# Patient Record
Sex: Female | Born: 1940 | Race: White | Hispanic: No | Marital: Single | Smoking: Never smoker
Health system: Southern US, Community
[De-identification: ages and names within clinical notes are randomized; demographics above are authoritative.]

## PROBLEM LIST (undated history)

## (undated) DIAGNOSIS — K589 Irritable bowel syndrome without diarrhea: Secondary | ICD-10-CM

## (undated) DIAGNOSIS — I480 Paroxysmal atrial fibrillation: Secondary | ICD-10-CM

## (undated) DIAGNOSIS — E039 Hypothyroidism, unspecified: Secondary | ICD-10-CM

---

## 1978-10-21 HISTORY — PX: BREAST BIOPSY: SHX20

## 2016-08-29 DIAGNOSIS — H3530 Unspecified macular degeneration: Secondary | ICD-10-CM | POA: Insufficient documentation

## 2018-04-30 DIAGNOSIS — H8110 Benign paroxysmal vertigo, unspecified ear: Secondary | ICD-10-CM | POA: Insufficient documentation

## 2018-12-14 DIAGNOSIS — I7782 Antineutrophilic cytoplasmic antibody (ANCA) vasculitis: Secondary | ICD-10-CM | POA: Insufficient documentation

## 2020-08-01 ENCOUNTER — Ambulatory Visit: Payer: Self-pay | Admitting: Nurse Practitioner

## 2020-08-01 ENCOUNTER — Ambulatory Visit: Payer: Medicare Other | Admitting: Nurse Practitioner

## 2020-08-01 ENCOUNTER — Other Ambulatory Visit: Payer: Self-pay

## 2020-08-01 VITALS — BP 120/70 | HR 75 | Temp 97.5°F | Ht 65.0 in | Wt 132.0 lb

## 2020-08-01 DIAGNOSIS — R059 Cough, unspecified: Secondary | ICD-10-CM | POA: Diagnosis not present

## 2020-08-01 LAB — POCT INFLUENZA A/B
Influenza A, POC: NEGATIVE
Influenza B, POC: NEGATIVE

## 2020-08-01 NOTE — Progress Notes (Signed)
Careteam: Patient Care Team: Margaretha Seeds, MD as PCP - General (Internal Medicine)  Advanced Directive information    Allergies  Allergen Reactions  . Sulfa Antibiotics Itching    Chief Complaint  Patient presents with  . Acute Visit    Patient having fatigue, cough , no chest pain, but tight chest. Patient has cough. She does not have diarrhea ,but rumbling. No los of taste or smell Patient started having symptoms after recieng Flu vaccine on 07/27/2020. Coughing has gotten worse within the past few days.     HPI: Patient is a 79 y.o. female seen in today at the [facility] for cough and fatigued.  Reports she has been fatigued for about 1 week. Reports she is fatigue but still able to get up and do her daily activity, cooked a chicken yesterday. Was able to eat normally.  Reports coughing started day before yesterday and last night had a bad episode that made it hard to get her breath. Drank tea with honey with helped the cough. No sore throat.  Spitting after her coughing episode Chest feels tight but no chest pains.  Breathing easily.  No shortness of breath.  No fever or chills.   Took tylenol last night.   Reports drippy nose  No body aches.   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 Has been around a lot of people with family.    Review of Systems:  Review of Systems  Constitutional: Negative for chills, fever and weight loss.  HENT: Negative for congestion, hearing loss, sinus pain, sore throat and tinnitus.   Respiratory: Positive for cough. Negative for sputum production and shortness of breath.   Cardiovascular: Negative for chest pain, palpitations and leg swelling.  Gastrointestinal: Negative for abdominal pain, constipation and diarrhea.  Genitourinary: Negative for dysuria, frequency and urgency.  Musculoskeletal: Negative for falls and joint pain.  Skin: Negative.   Neurological: Negative for  dizziness and headaches.  Psychiatric/Behavioral: Negative for depression and memory loss. The patient does not have insomnia.     No past medical history on file.  Social History:   has no history on file for tobacco use, alcohol use, and drug use.  No family history on file.  Medications: Patient's Medications  New Prescriptions   No medications on file  Previous Medications   AMLODIPINE (NORVASC) 10 MG TABLET    Take 10 mg by mouth daily.   CALCIUM-VITAMIN D (OSCAL WITH D) 500-200 MG-UNIT TABLET    Take 1 tablet by mouth.   LEVOTHYROXINE (SYNTHROID) 125 MCG TABLET    Take 125 mcg by mouth daily before breakfast.   MULTIPLE VITAMINS-MINERALS (PRESERVISION AREDS 2+MULTI VIT PO)    Take by mouth 2 (two) times daily.  Modified Medications   No medications on file  Discontinued Medications   No medications on file    Physical Exam:  Vitals:   08/01/20 1517  BP: 120/70  Pulse: 75  Temp: (!) 97.5 F (36.4 C)  SpO2: 96%  Weight: 132 lb (59.9 kg)  Height: 5\' 5"  (1.651 m)   Body mass index is 21.97 kg/m. Wt Readings from Last 3 Encounters:  08/01/20 132 lb (59.9 kg)    Physical Exam Constitutional:      Appearance: Normal appearance.  HENT:     Head: Normocephalic and atraumatic.     Right Ear: Tympanic membrane, ear canal and external ear normal.  Left Ear: Tympanic membrane, ear canal and external ear normal.     Nose: Congestion and rhinorrhea present.     Mouth/Throat:     Mouth: Mucous membranes are moist.  Eyes:     Pupils: Pupils are equal, round, and reactive to light.  Cardiovascular:     Rate and Rhythm: Normal rate and regular rhythm.     Pulses: Normal pulses.     Heart sounds: Normal heart sounds.  Pulmonary:     Effort: Pulmonary effort is normal.     Breath sounds: Normal breath sounds.  Abdominal:     General: Abdomen is flat.     Palpations: Abdomen is soft.  Skin:    General: Skin is warm and dry.  Neurological:     Mental Status: She  is alert and oriented to person, place, and time.  Psychiatric:        Mood and Affect: Mood normal.        Behavior: Behavior normal.     Labs reviewed: Basic Metabolic Panel: No results for input(s): NA, K, CL, CO2, GLUCOSE, BUN, CREATININE, CALCIUM, MG, PHOS, TSH in the last 8760 hours. Liver Function Tests: No results for input(s): AST, ALT, ALKPHOS, BILITOT, PROT, ALBUMIN in the last 8760 hours. No results for input(s): LIPASE, AMYLASE in the last 8760 hours. No results for input(s): AMMONIA in the last 8760 hours. CBC: No results for input(s): WBC, NEUTROABS, HGB, HCT, MCV, PLT in the last 8760 hours. Lipid Panel: No results for input(s): CHOL, HDL, LDLCALC, TRIG, CHOLHDL, LDLDIRECT in the last 8760 hours. TSH: No results for input(s): TSH in the last 8760 hours. A1C: No results found for: HGBA1C   Assessment/Plan 1. Cough -with fatigue. Vital signs normal and exam reassuring. No other symptoms at this time. Has been feeling poorly over the last week after she visited with her family and was around a lot of younger children and family.  -flu negative. -likely viral after being around a lot of young school age children, will rule out COVID and she will stay quarantined until test resulted.  -continue to make sure staying well hydrated  - SARS-COV-2 RNA,(COVID-19) QUAL NAAT -can use mucinex DM by mouth twice daily with full glass of water.  -strict return precautions given.    Janene Harvey. Biagio Borg  [facility] [phone]

## 2020-08-01 NOTE — Telephone Encounter (Signed)
Called patient to get update on allergies and medications prior to visit. This encounter was created in error - please disregard.

## 2020-08-01 NOTE — Patient Instructions (Addendum)
  May use tylenol 325 mg 2 tablets every 6 hours as needed aches and pains or sore throat humidifier in the home to help with the dry air Mucinex DM by mouth twice daily as needed for cough and congestion with full glass of water  Keep well hydrated Avoid forcefully blowing nose

## 2020-08-03 LAB — UNLABELED: Test Ordered On Req: 3944

## 2020-08-04 ENCOUNTER — Telehealth: Payer: Self-pay | Admitting: *Deleted

## 2020-08-04 NOTE — Telephone Encounter (Signed)
The lab checked on it yesterday and said the results would not be back for 48 hours.

## 2020-08-04 NOTE — Telephone Encounter (Signed)
Patient called and stated that you saw her on Tuesday and did a Covid Test. Stated you told her that it should be back today and she has not heard anything and wonders if you received the results.  Please Advise.

## 2020-08-04 NOTE — Telephone Encounter (Signed)
Patient notified

## 2020-08-07 LAB — PAT ID TIQ DOC: Test Affected: 39448

## 2020-08-07 LAB — SARS-COV-2 RNA,(COVID-19) QUALITATIVE NAAT: SARS CoV2 RNA: NOT DETECTED

## 2020-08-07 LAB — HEMOGLOBIN A1C W/OUT EAG

## 2021-03-30 DIAGNOSIS — M810 Age-related osteoporosis without current pathological fracture: Secondary | ICD-10-CM | POA: Insufficient documentation

## 2021-04-02 ENCOUNTER — Other Ambulatory Visit: Payer: Self-pay | Admitting: Infectious Diseases

## 2021-04-02 DIAGNOSIS — Z1231 Encounter for screening mammogram for malignant neoplasm of breast: Secondary | ICD-10-CM

## 2021-04-12 ENCOUNTER — Other Ambulatory Visit: Payer: Self-pay

## 2021-04-12 ENCOUNTER — Ambulatory Visit
Admission: RE | Admit: 2021-04-12 | Discharge: 2021-04-12 | Disposition: A | Discharge status: Home / Self Care | Payer: Medicare Other | Source: Ambulatory Visit | Attending: Infectious Diseases | Admitting: Infectious Diseases

## 2021-04-12 DIAGNOSIS — Z1231 Encounter for screening mammogram for malignant neoplasm of breast: Secondary | ICD-10-CM | POA: Diagnosis present

## 2021-04-20 ENCOUNTER — Inpatient Hospital Stay
Admission: RE | Admit: 2021-04-20 | Discharge: 2021-04-20 | Disposition: A | Discharge status: Home / Self Care | Payer: Self-pay | Source: Ambulatory Visit | Attending: *Deleted | Admitting: *Deleted

## 2021-04-20 ENCOUNTER — Other Ambulatory Visit: Payer: Self-pay | Admitting: *Deleted

## 2021-04-20 DIAGNOSIS — Z1231 Encounter for screening mammogram for malignant neoplasm of breast: Secondary | ICD-10-CM

## 2021-04-20 LAB — COLOGUARD: COLOGUARD: NEGATIVE

## 2021-04-20 LAB — EXTERNAL GENERIC LAB PROCEDURE: COLOGUARD: NEGATIVE

## 2022-07-09 ENCOUNTER — Emergency Department
Admission: EM | Admit: 2022-07-09 | Discharge: 2022-07-09 | Disposition: A | Discharge status: Home / Self Care | Payer: Medicare Other | Attending: Emergency Medicine | Admitting: Emergency Medicine

## 2022-07-09 DIAGNOSIS — I10 Essential (primary) hypertension: Secondary | ICD-10-CM | POA: Insufficient documentation

## 2022-07-09 DIAGNOSIS — R42 Dizziness and giddiness: Secondary | ICD-10-CM | POA: Diagnosis present

## 2022-07-09 DIAGNOSIS — E039 Hypothyroidism, unspecified: Secondary | ICD-10-CM | POA: Insufficient documentation

## 2022-07-09 DIAGNOSIS — R55 Syncope and collapse: Secondary | ICD-10-CM | POA: Diagnosis not present

## 2022-07-09 LAB — CBC
HCT: 38.1 % (ref 36.0–46.0)
Hemoglobin: 12.2 g/dL (ref 12.0–15.0)
MCH: 29.5 pg (ref 26.0–34.0)
MCHC: 32 g/dL (ref 30.0–36.0)
MCV: 92 fL (ref 80.0–100.0)
Platelets: 204 10*3/uL (ref 150–400)
RBC: 4.14 MIL/uL (ref 3.87–5.11)
RDW: 11.9 % (ref 11.5–15.5)
WBC: 6 10*3/uL (ref 4.0–10.5)
nRBC: 0 % (ref 0.0–0.2)

## 2022-07-09 LAB — URINALYSIS, ROUTINE W REFLEX MICROSCOPIC
Bacteria, UA: NONE SEEN
Bilirubin Urine: NEGATIVE
Glucose, UA: NEGATIVE mg/dL
Hgb urine dipstick: NEGATIVE
Ketones, ur: NEGATIVE mg/dL
Nitrite: NEGATIVE
Protein, ur: 100 mg/dL — AB
Specific Gravity, Urine: 1.013 (ref 1.005–1.030)
Squamous Epithelial / HPF: NONE SEEN (ref 0–5)
pH: 6 (ref 5.0–8.0)

## 2022-07-09 LAB — BASIC METABOLIC PANEL
Anion gap: 6 (ref 5–15)
BUN: 16 mg/dL (ref 8–23)
CO2: 21 mmol/L — ABNORMAL LOW (ref 22–32)
Calcium: 8.2 mg/dL — ABNORMAL LOW (ref 8.9–10.3)
Chloride: 114 mmol/L — ABNORMAL HIGH (ref 98–111)
Creatinine, Ser: 0.95 mg/dL (ref 0.44–1.00)
GFR, Estimated: 60 mL/min — ABNORMAL LOW (ref >60)
Glucose, Bld: 131 mg/dL — ABNORMAL HIGH (ref 70–99)
Potassium: 3.8 mmol/L (ref 3.5–5.1)
Sodium: 141 mmol/L (ref 135–145)

## 2022-07-09 LAB — TROPONIN I (HIGH SENSITIVITY): Troponin I (High Sensitivity): 9 ng/L (ref <18)

## 2022-07-09 LAB — TSH: TSH: 2.174 u[IU]/mL (ref 0.350–4.500)

## 2022-07-09 MED ORDER — SODIUM CHLORIDE 0.9 % IV BOLUS
500.0000 mL | Freq: Once | INTRAVENOUS | Status: AC
  Administered 2022-07-09: 500 mL via INTRAVENOUS

## 2022-07-09 NOTE — ED Notes (Signed)
ED Provider at bedside. 

## 2022-07-09 NOTE — ED Triage Notes (Signed)
No loc, fell down , no co of pain , pt from [facility]

## 2022-07-09 NOTE — ED Provider Notes (Signed)
[facility] Provider Note    Event Date/Time   First MD Initiated Contact with Patient 07/09/22 1646     (approximate)   History   Loss of Consciousness (Pt fell , no loc ) and Near Syncope (No loc, fell down , no co of pain , pt from [facility] )   HPI  [facility] is a 81 y.o. female who on review of previous records from [place] system has a history of hypothyroidism, hypertension and palpitations   [facility] also reports that she has had 2 previous episodes where she has passed out, and about 2 years ago she was admitted and a different hospital and had a evaluation and work-up at that time for a syncopal episode that occurred while she was at a grandchild's play performance.  Today she was in her normal health, had exercise at the pool, ate a small lunch, and went to play bridge.  She reports she started feeling lightheaded when she was standing and walking at the bridge game, and then as she was there she started to feel very lightheaded and knew she was going to pass out.  She did not fully pass out but rather went to the floor, and did not strike her head or suffer injury.  She reports people there also told her she did not fully pass out, but she felt as though she was about to.  She had no preceding symptoms of chest pain nausea or vomiting.  She felt slightly lightheaded for about the for 5 minutes proceeding now.  No symptoms now.  She feels well.  She reports that the EMT at [facility] or nurse checked her blood pressure and it was 80 on their first check at the facility.    Physical Exam   Triage Vital Signs: ED Triage Vitals  Enc Vitals Group     BP 07/09/22 1416 105/65     Pulse Rate 07/09/22 1416 62     Resp 07/09/22 1416 17     Temp 07/09/22 1416 98.8 F (37.1 C)     Temp Source 07/09/22 1416 Oral     SpO2 07/09/22 1416 98 %     Weight 07/09/22 1417 154 lb 5.2 oz (70 kg)     Height 07/09/22 1417 5\' 5"  (1.651 m)     Head Circumference  --      Peak Flow --      Pain Score 07/09/22 1417 0     Pain Loc --      Pain Edu? --      Excl. in [place]? --     Most recent vital signs: Vitals:   07/09/22 1732 07/09/22 1830  BP: 118/66 (!) 143/82  Pulse: 63 85  Resp: 16   Temp: 97.9 F (36.6 C)   SpO2: 99% 99%     General: Awake, no distress.  Very pleasant.  Fully oriented without distress or concern.  Reports she feels well at this time CV:  Good peripheral perfusion.  Normal heart tones.  Occasional slight irregularity.  I reviewed telemetry/rhythm strip, and I do not see evidence of notable ectopy.  Heart tones are normal and there are no murmurs.Resp:  Normal effort.  Bilateral clear with normal work of breathing Abd:  No distention.  Soft nontender nondistended Other:  No lower extremity edema venous cords or congestion  Normal smile.  Normal extraocular movements.  Moves all extremities without difficulty.  No notable neurologic deficits and normocephalic atraumatic  ED Results / Procedures / Treatments   Labs (all labs ordered are listed, but only abnormal results are displayed) Labs Reviewed  BASIC METABOLIC PANEL - Abnormal; Notable for the following components:      Result Value   Chloride 114 (*)    CO2 21 (*)    Glucose, Bld 131 (*)    Calcium 8.2 (*)    GFR, Estimated 60 (*)    All other components within normal limits  URINALYSIS, ROUTINE W REFLEX MICROSCOPIC - Abnormal; Notable for the following components:   Color, Urine YELLOW (*)    APPearance HAZY (*)    Protein, ur 100 (*)    Leukocytes,Ua SMALL (*)    All other components within normal limits  CBC  TSH  CBG MONITORING, ED  TROPONIN I (HIGH SENSITIVITY)     EKG  Interpreted by me at 1435 heart rate approximately 60.  Normal sinus rhythm.  T waves with a somewhat peaked to slight hyperacute appearance and in anterior and left precordial distribution.  Very mild nonspecific T wave abnormality.  No evidence of obvious or frank ischemia  denoted. (No prior for comparison)    RADIOLOGY No indication for central neurologic imaging.  Patient with normal orientation normal neurologic exam at this time  No acute chest pain.  No dyspnea.   PROCEDURES:  Critical Care performed: No  Procedures   MEDICATIONS ORDERED IN ED: Medications  sodium chloride 0.9 % bolus 500 mL (0 mLs Intravenous Stopped 07/09/22 1800)     IMPRESSION / MDM / ASSESSMENT AND PLAN / ED COURSE  I reviewed the triage vital signs and the nursing notes.                              Differential diagnosis includes, but is not limited to, etiologies of syncope including metabolic, AKI, dehydration, orthostatic, arrhythmia, no clinical signs or symptoms to be suggestive of ACS, initial troponin normal, urinalysis without evidence of acute infection.  No associated infectious symptoms.  No neurologic or acute vascular symptoms  Patient reports a history of 2 previous syncopal episodes occurring in the last 2 years, 1 of which she did fully pass out and was admitted and worked up the other she reports she did not seek medical care for.  Reassuring exam at this time.  I do not see clear indication for admission, and I discussed with the patient potential for observation and further evaluation such as heart evaluation, evaluation for arrhythmia, echocardiogram to evaluate the heart and other causes of her passing out.  Patient though, quite reasonably in my opinion, reports she feels well now and would be comfortable following up outpatient with her doctor and a cardiologist.  I think this is quite reasonable given the presentation today and her full recovery.  Patient's presentation is most consistent with acute complicated illness / injury requiring diagnostic workup.  The patient is on the cardiac monitor to evaluate for evidence of arrhythmia and/or significant heart rate changes.  ----------------------------------------- 8:59 PM on  07/09/2022 -----------------------------------------   Patient resting comfortably.  Has been up and back and forth to the bathroom, eaten a meal and feels normal.  Resting without complaint no chest pain no palpitations.  She would like to follow-up with primary care and also have placed referral to follow-up with [facility] cardiology as she is generally a [facility] patient.  Return precautions and treatment recommendations and follow-up discussed with the patient who  is agreeable with the plan.      FINAL CLINICAL IMPRESSION(S) / ED DIAGNOSES   Final diagnoses:  Near syncope  Syncope, unspecified syncope type     Rx / DC Orders   ED Discharge Orders          Ordered    Ambulatory referral to Cardiology       Comments: [facility]) cardiology. Syncope work-up   07/09/22 2058             Note:  This document was prepared using Dragon voice recognition software and may include unintentional dictation errors.   Sharyn Creamer, MD 07/09/22 2059

## 2022-07-09 NOTE — ED Triage Notes (Signed)
First Nurse Note:  Pt via EMS from [facility], pt c/o diarrhea for last couple of days. Pt had a syncopal episode. Denies head injury, staff caught her.  EMS reports BP 80, EMS gave IV fluid approx 744mL, most recent BP 90/54 18 G R AC 97 HR  96% on RA

## 2022-07-09 NOTE — ED Notes (Signed)
States not enough urine at this time for Urinalysis at this time.

## 2022-11-16 IMAGING — MG MM DIGITAL SCREENING BILAT W/ TOMO AND CAD
8 series · 9 of 24 positions shown · non-contrast
Comparison: Previous exam(s).

CLINICAL DATA: Screening.

EXAM:
DIGITAL SCREENING BILATERAL MAMMOGRAM WITH TOMOSYNTHESIS AND CAD
TECHNIQUE: Bilateral screening digital craniocaudal and mediolateral oblique
mammograms were obtained. Bilateral screening digital breast
tomosynthesis was performed. The images were evaluated with
computer-aided detection.

[R MLO synth-2D]
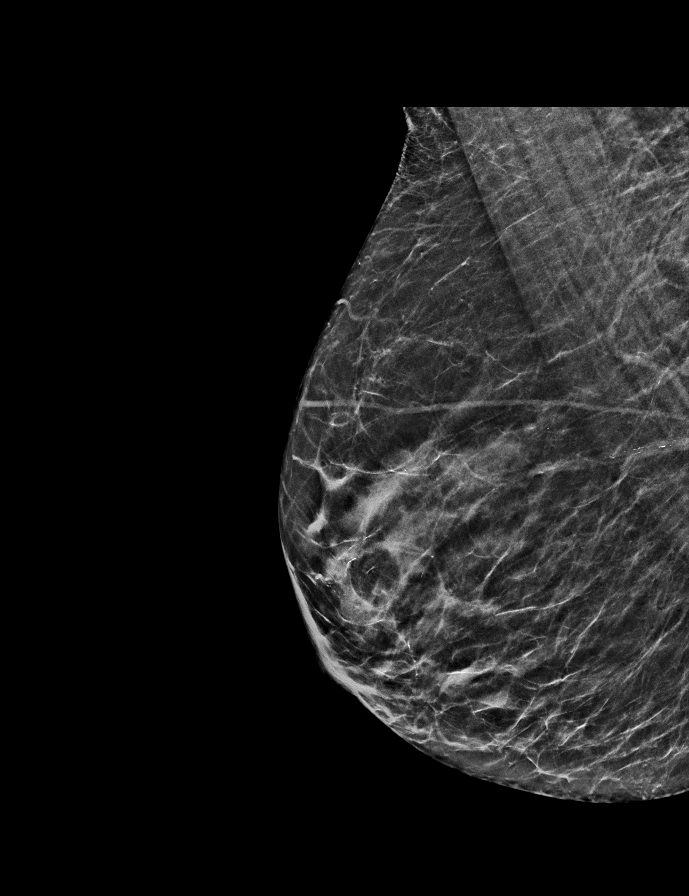

[L MLO synth-2D]
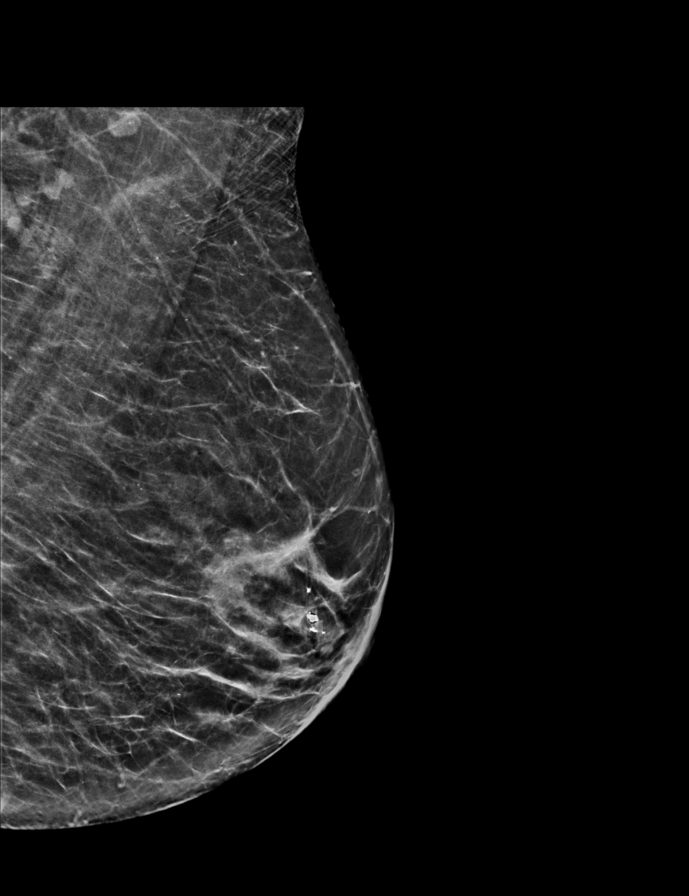

[R CC synth-2D]
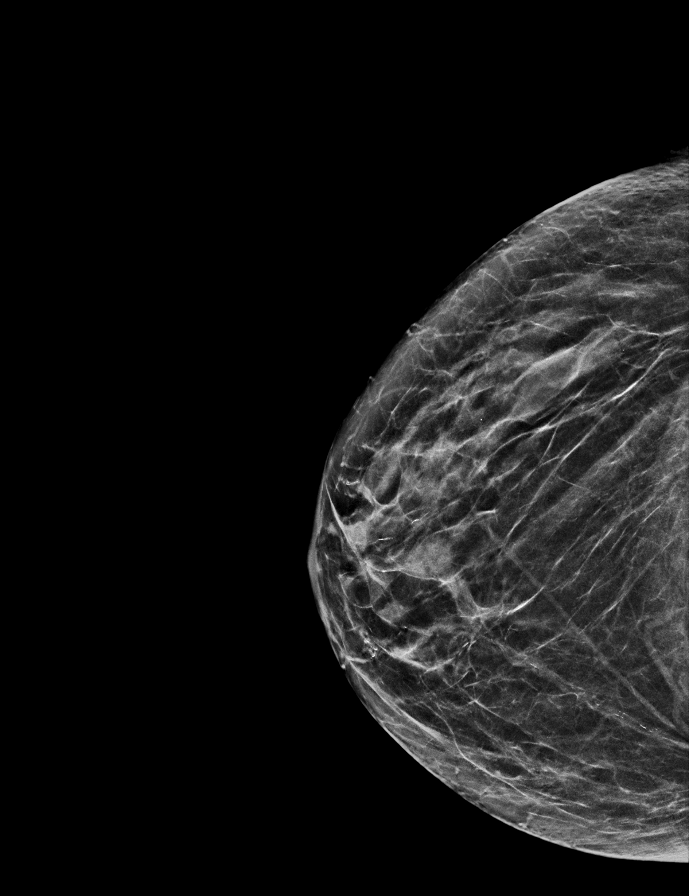

[L CC synth-2D]
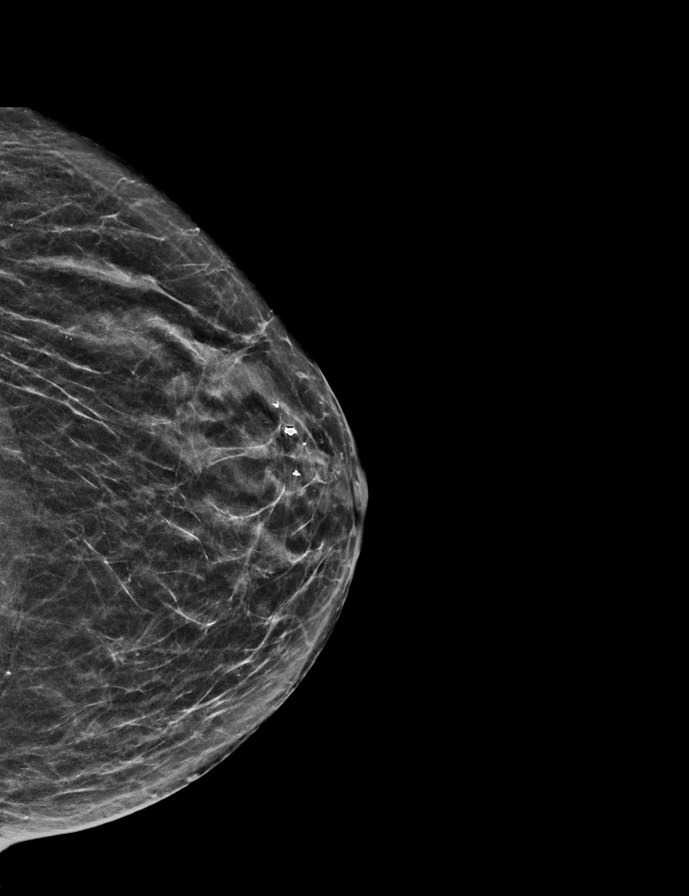

[L CC tomo · 2 of 47 frames shown]
[frame 16/47]
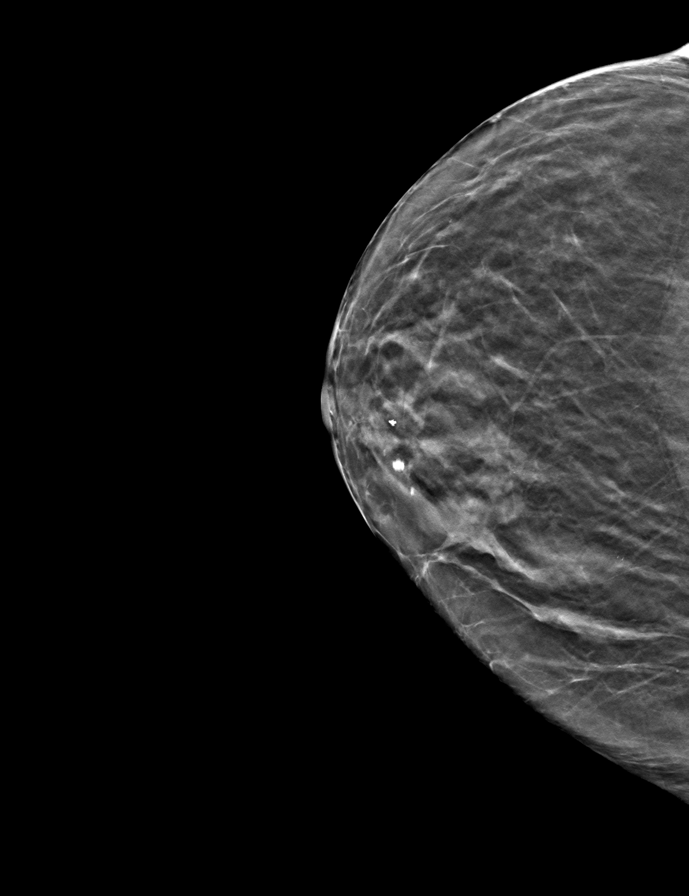
[frame 24/47]
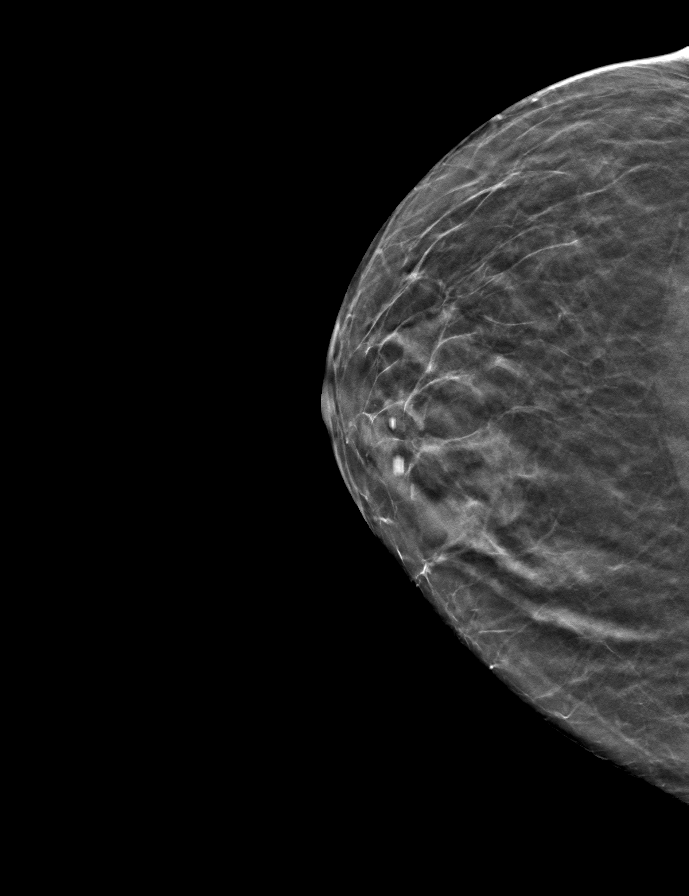

[R MLO tomo · tomo slice 25/48.0]
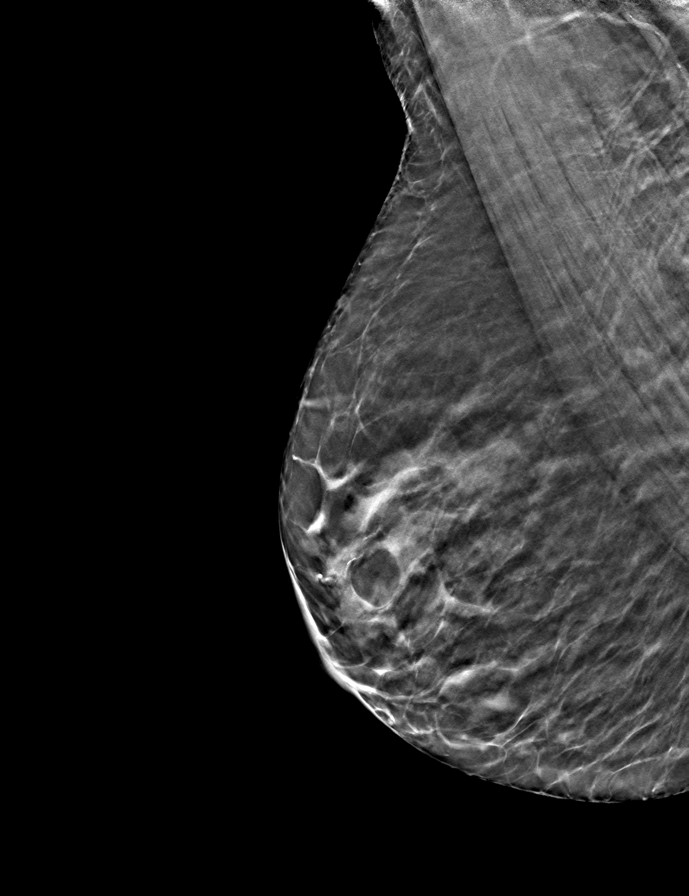

[L MLO tomo · tomo slice 25/50.0]
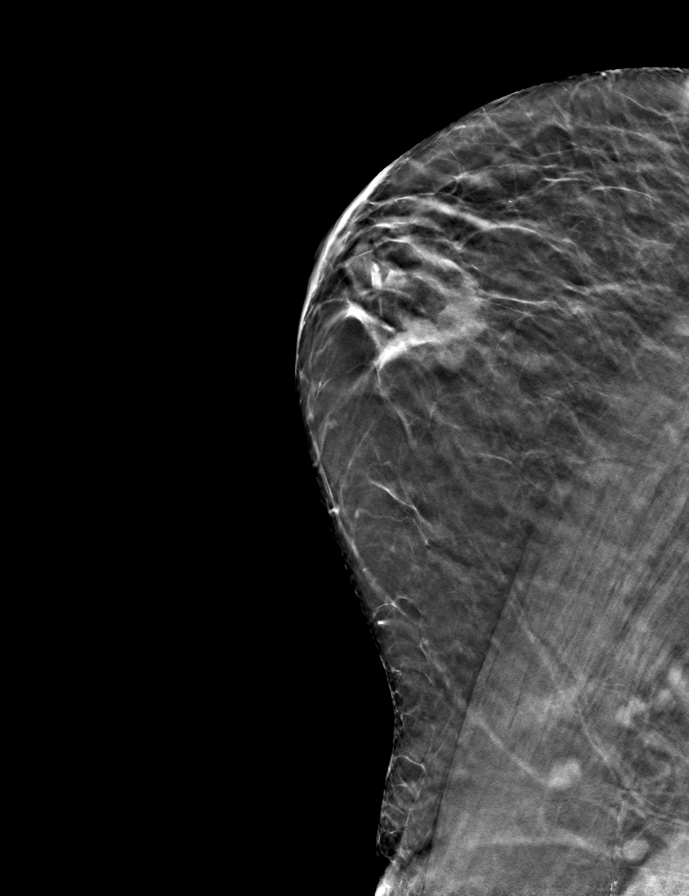

[R CC tomo · tomo slice 25/49.0]
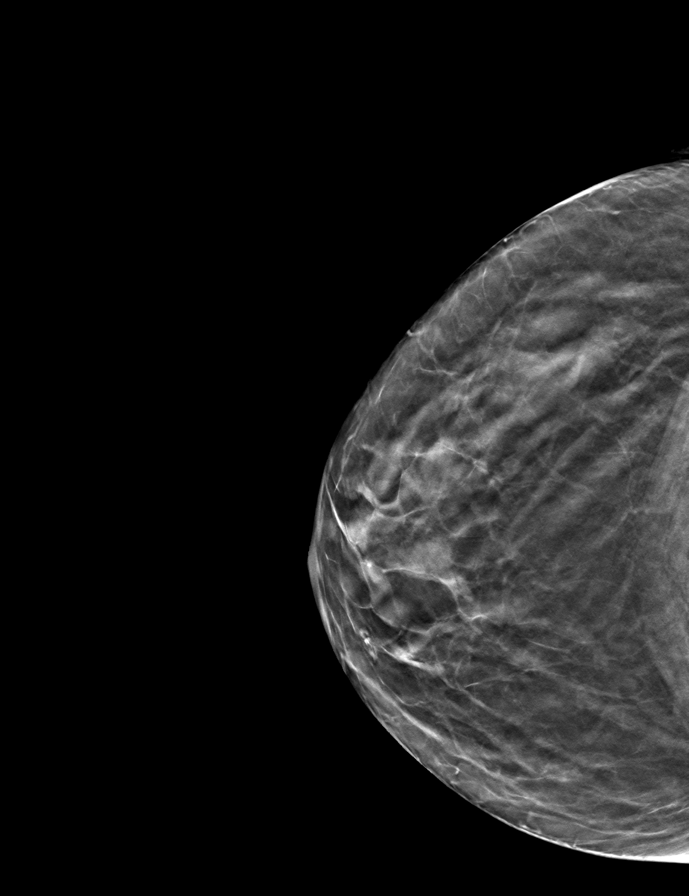

[9 of 24 positions shown; findings below may reference images not displayed]

ACR Breast Density Category b: There are scattered areas of
fibroglandular density.
FINDINGS: There are no findings suspicious for malignancy.
IMPRESSION: No mammographic evidence of malignancy. A result letter of this
screening mammogram will be mailed directly to the patient.

RECOMMENDATION:
Screening mammogram in one year. (Code:51-O-LD2)

BI-RADS CATEGORY  1: Negative.

## 2023-05-09 ENCOUNTER — Encounter: Payer: Self-pay | Admitting: Student

## 2023-05-09 ENCOUNTER — Ambulatory Visit: Payer: Medicare Other | Admitting: Student

## 2023-05-09 VITALS — BP 118/76 | HR 60 | Temp 97.4°F | Ht 65.0 in | Wt 123.0 lb

## 2023-05-09 DIAGNOSIS — L03115 Cellulitis of right lower limb: Secondary | ICD-10-CM

## 2023-05-09 DIAGNOSIS — B3731 Acute candidiasis of vulva and vagina: Secondary | ICD-10-CM

## 2023-05-09 MED ORDER — NYSTATIN 100000 UNIT/GM EX POWD: 1.0000 | Freq: Three times a day (TID) | CUTANEOUS | 0 refills | Status: DC

## 2023-05-09 MED ORDER — DOXYCYCLINE HYCLATE 100 MG PO TABS: 100.0000 mg | ORAL_TABLET | Freq: Two times a day (BID) | ORAL | 0 refills | Status: AC

## 2023-05-09 MED ORDER — DESITIN 40 % EX PSTE: 1.0000 | PASTE | Freq: Every day | CUTANEOUS | 0 refills | Status: DC

## 2023-05-09 NOTE — Patient Instructions (Signed)
For your right leg, please take doxycycline 100 mg in the morning and night for 7 days.   For your groin, please mix 1 teaspoon of powder with a teaspoon of desitin cream and apply to the groin area at night and in the morning for 7 days and then as needed (on days when you wear depend briefs).

## 2023-05-09 NOTE — Progress Notes (Signed)
[facility]   Provider: Dr. Earnestine Mealing  Code Status: Full Code Goals of Care:     05/09/2023    1:52 PM  Advanced Directives  Does Patient Have a Medical Advance Directive? Yes  Type of Estate agent of [place];Living will  Does patient want to make changes to medical advance directive? No - Patient declined  Copy of Healthcare Power of Attorney in Chart? No - copy requested     Chief Complaint  Patient presents with   Acute Visit    Stung by Wasp last Sunday on Right Leg and Spider bites on Fingers.     HPI: Patient is a 82 y.o. female seen today for an acute visit for Wasp Sting last Sunday to Right Leg and Spider Bites to fingers.   She had 3 wasp stings.   She had neosporin and covered it with Band-Aids and it seems to have expanded.   Her fingers she has some spots as well. She put a salve on it as well. She has some soreness on the fingers.   Groin area has had a rash for some time. Wears depends.   History reviewed. No pertinent past medical history.  Past Surgical History:  Procedure Laterality Date   BREAST BIOPSY Left 1980   needle bx x 2 Benign per pt    Allergies  Allergen Reactions   Cyclosporine Other (See Comments) and Rash    Burning   Sulfa Antibiotics Itching   Azithromycin Diarrhea    Exfoliative mucosal rash.    Outpatient Encounter Medications as of 05/09/2023  Medication Sig   brimonidine-timolol (COMBIGAN) 0.2-0.5 % ophthalmic solution Place 1 drop into both eyes every 12 (twelve) hours.   diltiazem (CARDIZEM) 90 MG tablet Take 90 mg by mouth at bedtime.   ELIQUIS 5 MG TABS tablet Take 5 mg by mouth 2 (two) times daily.   latanoprost (XALATAN) 0.005 % ophthalmic solution Place 1 drop into both eyes at bedtime.   levothyroxine (SYNTHROID) 125 MCG tablet Take 125 mcg by mouth daily before breakfast.   Multiple Vitamins-Minerals (PRESERVISION AREDS 2+MULTI VIT PO) Take by mouth 2 (two) times daily.    [DISCONTINUED] amLODipine (NORVASC) 10 MG tablet Take 10 mg by mouth daily.   [DISCONTINUED] calcium-vitamin D (OSCAL WITH D) 500-200 MG-UNIT tablet Take 1 tablet by mouth.   No facility-administered encounter medications on file as of 05/09/2023.    Review of Systems:  Review of Systems  Health Maintenance  Topic Date Due   Zoster Vaccines- Shingrix (1 of 2) Never done   DEXA SCAN  Never done   Medicare Annual Wellness (AWV)  01/25/2016   COVID-19 Vaccine (5 - 2023-24 season) 06/21/2022   INFLUENZA VACCINE  05/22/2023   DTaP/Tdap/Td (2 - Td or Tdap) 07/05/2029   Pneumonia Vaccine 75+ Years old  Completed   HPV VACCINES  Aged Out    Physical Exam: Vitals:   05/09/23 1343  BP: 118/76  Pulse: 60  Temp: (!) 97.4 F (36.3 C)  SpO2: 95%  Weight: 123 lb (55.8 kg)  Height: 5\' 5"  (1.651 m)   Body mass index is 20.47 kg/m. Physical Exam Skin:    Comments: Groin with erythematous rash and surrounding papules  Neurological:     Mental Status: She is alert.      Labs reviewed: Basic Metabolic Panel: Recent Labs    07/09/22 1420 07/09/22 1424  NA 141  --   K 3.8  --  CL 114*  --   CO2 21*  --   GLUCOSE 131*  --   BUN 16  --   CREATININE 0.95  --   CALCIUM 8.2*  --   TSH  --  2.174   Liver Function Tests: No results for input(s): "AST", "ALT", "ALKPHOS", "BILITOT", "PROT", "ALBUMIN" in the last 8760 hours. No results for input(s): "LIPASE", "AMYLASE" in the last 8760 hours. No results for input(s): "AMMONIA" in the last 8760 hours. CBC: Recent Labs    07/09/22 1420  WBC 6.0  HGB 12.2  HCT 38.1  MCV 92.0  PLT 204   Lipid Panel: No results for input(s): "CHOL", "HDL", "LDLCALC", "TRIG", "CHOLHDL", "LDLDIRECT" in the last 8760 hours. Lab Results  Component Value Date   HGBA1C CANCELED 08/01/2020    Procedures since last visit: No results found.  Assessment/Plan Candidiasis of female genitalia - Plan: Zinc Oxide (DESITIN) 40 % PSTE, nystatin  (MYCOSTATIN/NYSTOP) powder  Cellulitis of right lower extremity - Plan: doxycycline (VIBRA-TABS) 100 MG tablet Discussed concern for cellulitis from the wasp bite on Sunday. F/u wound check on Monday. Treatment for yeast infection. Encourage keeping groin area clean, dry especially during the summer months. Avoid neosporin, vaseline only. Encourage use of bandaids as well.   Labs/tests ordered:  * No order type specified * Next appt:  Visit date not found

## 2023-05-12 ENCOUNTER — Ambulatory Visit: Payer: Medicare Other | Admitting: Student

## 2023-05-12 VITALS — HR 73 | Temp 98.5°F

## 2023-05-12 DIAGNOSIS — L03115 Cellulitis of right lower limb: Secondary | ICD-10-CM

## 2023-05-12 DIAGNOSIS — I7782 Antineutrophilic cytoplasmic antibody (ANCA) vasculitis: Secondary | ICD-10-CM

## 2023-05-12 MED ORDER — PREDNISONE 20 MG PO TABS: 40.0000 mg | ORAL_TABLET | Freq: Every day | ORAL | 0 refills | Status: DC

## 2023-05-12 NOTE — Progress Notes (Signed)
Location:  [facility]   Place of Service:   [facility]  Provider: Shenekia Riess  Code Status: Full Code Goals of Care:     05/09/2023    1:52 PM  Advanced Directives  Does Patient Have a Medical Advance Directive? Yes  Type of Estate agent of [place];Living will  Does patient want to make changes to medical advance directive? No - Patient declined  Copy of Healthcare Power of Attorney in Chart? No - copy requested     No chief complaint on file.   HPI: Patient is a 82 y.o. female seen today for an acute visit for follow up of leg. She has no pain at this time. She feels better, but she has   No past medical history on file.  Past Surgical History:  Procedure Laterality Date   BREAST BIOPSY Left 1980   needle bx x 2 Benign per pt    Allergies  Allergen Reactions   Cyclosporine Other (See Comments) and Rash    Burning   Sulfa Antibiotics Itching   Azithromycin Diarrhea    Exfoliative mucosal rash.    Outpatient Encounter Medications as of 05/12/2023  Medication Sig   predniSONE (DELTASONE) 20 MG tablet Take 2 tablets (40 mg total) by mouth daily with breakfast.   brimonidine-timolol (COMBIGAN) 0.2-0.5 % ophthalmic solution Place 1 drop into both eyes every 12 (twelve) hours.   diltiazem (CARDIZEM) 90 MG tablet Take 90 mg by mouth at bedtime.   doxycycline (VIBRA-TABS) 100 MG tablet Take 1 tablet (100 mg total) by mouth 2 (two) times daily for 7 days.   ELIQUIS 5 MG TABS tablet Take 5 mg by mouth 2 (two) times daily.   latanoprost (XALATAN) 0.005 % ophthalmic solution Place 1 drop into both eyes at bedtime.   levothyroxine (SYNTHROID) 125 MCG tablet Take 125 mcg by mouth daily before breakfast.   Multiple Vitamins-Minerals (PRESERVISION AREDS 2+MULTI VIT PO) Take by mouth 2 (two) times daily.   nystatin (MYCOSTATIN/NYSTOP) powder Apply 1 Application topically 3 (three) times daily.   Zinc Oxide (DESITIN) 40 % PSTE Apply 1 Application topically  daily.   No facility-administered encounter medications on file as of 05/12/2023.    Review of Systems:  Review of Systems  Health Maintenance  Topic Date Due   Zoster Vaccines- Shingrix (1 of 2) Never done   DEXA SCAN  Never done   Medicare Annual Wellness (AWV)  01/25/2016   COVID-19 Vaccine (5 - 2023-24 season) 06/21/2022   INFLUENZA VACCINE  05/22/2023   DTaP/Tdap/Td (2 - Td or Tdap) 07/05/2029   Pneumonia Vaccine 64+ Years old  Completed   HPV VACCINES  Aged Out    Physical Exam: Vitals:   05/12/23 1339  Pulse: 73  Temp: 98.5 F (36.9 C)  SpO2: 99%   There is no height or weight on file to calculate BMI. Physical Exam  Labs reviewed: Basic Metabolic Panel: Recent Labs    07/09/22 1420 07/09/22 1424  NA 141  --   K 3.8  --   CL 114*  --   CO2 21*  --   GLUCOSE 131*  --   BUN 16  --   CREATININE 0.95  --   CALCIUM 8.2*  --   TSH  --  2.174   Liver Function Tests: No results for input(s): "AST", "ALT", "ALKPHOS", "BILITOT", "PROT", "ALBUMIN" in the last 8760 hours. No results for input(s): "LIPASE", "AMYLASE" in the last 8760 hours. No results  for input(s): "AMMONIA" in the last 8760 hours. CBC: Recent Labs    07/09/22 1420  WBC 6.0  HGB 12.2  HCT 38.1  MCV 92.0  PLT 204   Lipid Panel: No results for input(s): "CHOL", "HDL", "LDLCALC", "TRIG", "CHOLHDL", "LDLDIRECT" in the last 8760 hours. Lab Results  Component Value Date   HGBA1C CANCELED 08/01/2020    Procedures since last visit: No results found.  Assessment/Plan Cellulitis of right lower extremity - Plan: predniSONE (DELTASONE) 20 MG tablet  ANCA-positive vasculitis (HCC) - Plan: predniSONE (DELTASONE) 20 MG tablet Worsening redness, however, decreased tenderness. Some of this reaction could be due to underlying vasculitis as it is on both lower extremities. Will start prednisone 40 mg for 5 days. Continue abx to completion . F/u with pcp on 8/9.   Labs/tests ordered:  * No order type  specified * Next appt:  Visit date not found

## 2023-05-12 NOTE — Patient Instructions (Addendum)
Please take prednisone 40 mg each morning for the next 5 days.

## 2023-10-14 ENCOUNTER — Inpatient Hospital Stay
Admission: EM | Admit: 2023-10-14 | Discharge: 2023-10-20 | DRG: 309 | Disposition: A | Discharge status: Home / Self Care | Payer: Medicare Other | Attending: Internal Medicine | Admitting: Internal Medicine

## 2023-10-14 ENCOUNTER — Emergency Department: Payer: Medicare Other

## 2023-10-14 ENCOUNTER — Other Ambulatory Visit: Payer: Self-pay

## 2023-10-14 DIAGNOSIS — G8929 Other chronic pain: Secondary | ICD-10-CM | POA: Diagnosis present

## 2023-10-14 DIAGNOSIS — Z1611 Resistance to penicillins: Secondary | ICD-10-CM | POA: Diagnosis present

## 2023-10-14 DIAGNOSIS — I2489 Other forms of acute ischemic heart disease: Secondary | ICD-10-CM | POA: Diagnosis present

## 2023-10-14 DIAGNOSIS — B961 Klebsiella pneumoniae [K. pneumoniae] as the cause of diseases classified elsewhere: Secondary | ICD-10-CM | POA: Diagnosis present

## 2023-10-14 DIAGNOSIS — M51369 Other intervertebral disc degeneration, lumbar region without mention of lumbar back pain or lower extremity pain: Secondary | ICD-10-CM | POA: Diagnosis present

## 2023-10-14 DIAGNOSIS — Z882 Allergy status to sulfonamides status: Secondary | ICD-10-CM

## 2023-10-14 DIAGNOSIS — M545 Low back pain, unspecified: Secondary | ICD-10-CM | POA: Diagnosis not present

## 2023-10-14 DIAGNOSIS — Z803 Family history of malignant neoplasm of breast: Secondary | ICD-10-CM

## 2023-10-14 DIAGNOSIS — Z1629 Resistance to other single specified antibiotic: Secondary | ICD-10-CM | POA: Diagnosis present

## 2023-10-14 DIAGNOSIS — Z79899 Other long term (current) drug therapy: Secondary | ICD-10-CM

## 2023-10-14 DIAGNOSIS — I480 Paroxysmal atrial fibrillation: Secondary | ICD-10-CM | POA: Diagnosis not present

## 2023-10-14 DIAGNOSIS — N390 Urinary tract infection, site not specified: Secondary | ICD-10-CM | POA: Diagnosis not present

## 2023-10-14 DIAGNOSIS — R54 Age-related physical debility: Secondary | ICD-10-CM | POA: Diagnosis present

## 2023-10-14 DIAGNOSIS — E039 Hypothyroidism, unspecified: Secondary | ICD-10-CM

## 2023-10-14 DIAGNOSIS — I10 Essential (primary) hypertension: Secondary | ICD-10-CM | POA: Diagnosis not present

## 2023-10-14 DIAGNOSIS — Z79890 Hormone replacement therapy (postmenopausal): Secondary | ICD-10-CM

## 2023-10-14 DIAGNOSIS — I776 Arteritis, unspecified: Secondary | ICD-10-CM | POA: Diagnosis present

## 2023-10-14 DIAGNOSIS — I4891 Unspecified atrial fibrillation: Principal | ICD-10-CM | POA: Diagnosis present

## 2023-10-14 DIAGNOSIS — N200 Calculus of kidney: Secondary | ICD-10-CM | POA: Diagnosis present

## 2023-10-14 DIAGNOSIS — Z881 Allergy status to other antibiotic agents status: Secondary | ICD-10-CM

## 2023-10-14 DIAGNOSIS — Z7901 Long term (current) use of anticoagulants: Secondary | ICD-10-CM

## 2023-10-14 DIAGNOSIS — M549 Dorsalgia, unspecified: Secondary | ICD-10-CM

## 2023-10-14 DIAGNOSIS — Z6820 Body mass index (BMI) 20.0-20.9, adult: Secondary | ICD-10-CM

## 2023-10-14 DIAGNOSIS — J8410 Pulmonary fibrosis, unspecified: Secondary | ICD-10-CM | POA: Diagnosis present

## 2023-10-14 HISTORY — DX: Paroxysmal atrial fibrillation: I48.0

## 2023-10-14 HISTORY — DX: Hypothyroidism, unspecified: E03.9

## 2023-10-14 HISTORY — DX: Irritable bowel syndrome, unspecified: K58.9

## 2023-10-14 LAB — COMPREHENSIVE METABOLIC PANEL
ALT: 26 U/L (ref 0–44)
AST: 22 U/L (ref 15–41)
Albumin: 5.2 g/dL — ABNORMAL HIGH (ref 3.5–5.0)
Alkaline Phosphatase: 121 U/L (ref 38–126)
Anion gap: 14 (ref 5–15)
BUN: 20 mg/dL (ref 8–23)
CO2: 23 mmol/L (ref 22–32)
Calcium: 9.8 mg/dL (ref 8.9–10.3)
Chloride: 95 mmol/L — ABNORMAL LOW (ref 98–111)
Creatinine, Ser: 0.93 mg/dL (ref 0.44–1.00)
GFR, Estimated: >60 mL/min (ref >60)
Glucose, Bld: 108 mg/dL — ABNORMAL HIGH (ref 70–99)
Potassium: 3.5 mmol/L (ref 3.5–5.1)
Sodium: 132 mmol/L — ABNORMAL LOW (ref 135–145)
Total Bilirubin: 2.1 mg/dL — ABNORMAL HIGH (ref <1.2)
Total Protein: 8.8 g/dL — ABNORMAL HIGH (ref 6.5–8.1)

## 2023-10-14 LAB — TROPONIN I (HIGH SENSITIVITY)
Troponin I (High Sensitivity): 19 ng/L — ABNORMAL HIGH (ref <18)
Troponin I (High Sensitivity): 22 ng/L — ABNORMAL HIGH (ref <18)

## 2023-10-14 LAB — URINALYSIS, COMPLETE (UACMP) WITH MICROSCOPIC
Bilirubin Urine: NEGATIVE
Glucose, UA: NEGATIVE mg/dL
Hgb urine dipstick: NEGATIVE
Ketones, ur: 5 mg/dL — AB
Nitrite: POSITIVE — AB
Protein, ur: 30 mg/dL — AB
Specific Gravity, Urine: 1.008 (ref 1.005–1.030)
Squamous Epithelial / HPF: 0 /[HPF] (ref 0–5)
pH: 6 (ref 5.0–8.0)

## 2023-10-14 LAB — BRAIN NATRIURETIC PEPTIDE: B Natriuretic Peptide: 635.6 pg/mL — ABNORMAL HIGH (ref 0.0–100.0)

## 2023-10-14 LAB — CBC
HCT: 49.7 % — ABNORMAL HIGH (ref 36.0–46.0)
Hemoglobin: 16.5 g/dL — ABNORMAL HIGH (ref 12.0–15.0)
MCH: 30.8 pg (ref 26.0–34.0)
MCHC: 33.2 g/dL (ref 30.0–36.0)
MCV: 92.7 fL (ref 80.0–100.0)
Platelets: 304 10*3/uL (ref 150–400)
RBC: 5.36 MIL/uL — ABNORMAL HIGH (ref 3.87–5.11)
RDW: 12.7 % (ref 11.5–15.5)
WBC: 9.9 10*3/uL (ref 4.0–10.5)
nRBC: 0 % (ref 0.0–0.2)

## 2023-10-14 LAB — TSH: TSH: 9.948 u[IU]/mL — ABNORMAL HIGH (ref 0.350–4.500)

## 2023-10-14 MED ORDER — HYDROCODONE-ACETAMINOPHEN 5-325 MG PO TABS
1.0000 | ORAL_TABLET | ORAL | Status: DC | PRN
  Administered 2023-10-14: 1 via ORAL
  Filled 2023-10-14: qty 1

## 2023-10-14 MED ORDER — ACETAMINOPHEN 650 MG RE SUPP: 650.0000 mg | Freq: Four times a day (QID) | RECTAL | Status: DC | PRN

## 2023-10-14 MED ORDER — LACTATED RINGERS IV SOLN: INTRAVENOUS | Status: DC

## 2023-10-14 MED ORDER — BRIMONIDINE TARTRATE-TIMOLOL 0.2-0.5 % OP SOLN
1.0000 [drp] | Freq: Two times a day (BID) | OPHTHALMIC | Status: DC
  Filled 2023-10-14: qty 5

## 2023-10-14 MED ORDER — CEFTRIAXONE SODIUM 1 G IJ SOLR
1.0000 g | Freq: Once | INTRAMUSCULAR | Status: AC
  Administered 2023-10-14: 1 g via INTRAVENOUS
  Filled 2023-10-14: qty 10

## 2023-10-14 MED ORDER — DILTIAZEM HCL 25 MG/5ML IV SOLN
10.0000 mg | Freq: Once | INTRAVENOUS | Status: AC
  Administered 2023-10-14: 10 mg via INTRAVENOUS
  Filled 2023-10-14: qty 5

## 2023-10-14 MED ORDER — LACTATED RINGERS IV SOLN: INTRAVENOUS | Status: AC

## 2023-10-14 MED ORDER — SODIUM CHLORIDE 0.9 % IV BOLUS
500.0000 mL | Freq: Once | INTRAVENOUS | Status: AC
  Administered 2023-10-14: 500 mL via INTRAVENOUS

## 2023-10-14 MED ORDER — TIMOLOL MALEATE 0.5 % OP SOLN
1.0000 [drp] | Freq: Two times a day (BID) | OPHTHALMIC | Status: DC
  Filled 2023-10-14: qty 5

## 2023-10-14 MED ORDER — ACETAMINOPHEN 325 MG PO TABS
650.0000 mg | ORAL_TABLET | Freq: Four times a day (QID) | ORAL | Status: DC | PRN
  Administered 2023-10-14 – 2023-10-15 (×3): 650 mg via ORAL
  Filled 2023-10-14 (×2): qty 2

## 2023-10-14 MED ORDER — ONDANSETRON HCL 4 MG PO TABS
4.0000 mg | ORAL_TABLET | Freq: Four times a day (QID) | ORAL | Status: DC | PRN
  Administered 2023-10-14 – 2023-10-15 (×2): 4 mg via ORAL
  Filled 2023-10-14 (×2): qty 1

## 2023-10-14 MED ORDER — DILTIAZEM HCL-DEXTROSE 125-5 MG/125ML-% IV SOLN (PREMIX)
5.0000 mg/h | INTRAVENOUS | Status: AC
  Administered 2023-10-14: 5 mg/h via INTRAVENOUS
  Administered 2023-10-14 – 2023-10-16 (×4): 15 mg/h via INTRAVENOUS
  Administered 2023-10-16: 10 mg/h via INTRAVENOUS
  Administered 2023-10-17: 5 mg/h via INTRAVENOUS
  Administered 2023-10-17: 7.5 mg/h via INTRAVENOUS
  Filled 2023-10-14 (×6): qty 125

## 2023-10-14 MED ORDER — LATANOPROST 0.005 % OP SOLN
1.0000 [drp] | Freq: Every day | OPHTHALMIC | Status: DC
  Administered 2023-10-15 – 2023-10-19 (×6): 1 [drp] via OPHTHALMIC
  Filled 2023-10-14 (×2): qty 2.5

## 2023-10-14 MED ORDER — BRIMONIDINE TARTRATE 0.2 % OP SOLN
1.0000 [drp] | Freq: Two times a day (BID) | OPHTHALMIC | Status: DC
  Administered 2023-10-15 – 2023-10-20 (×11): 1 [drp] via OPHTHALMIC
  Filled 2023-10-14: qty 5

## 2023-10-14 MED ORDER — ONDANSETRON HCL 4 MG/2ML IJ SOLN
4.0000 mg | Freq: Four times a day (QID) | INTRAMUSCULAR | Status: DC | PRN
  Administered 2023-10-16: 4 mg via INTRAVENOUS
  Filled 2023-10-14: qty 2

## 2023-10-14 MED ORDER — SODIUM CHLORIDE 0.9% FLUSH
3.0000 mL | Freq: Two times a day (BID) | INTRAVENOUS | Status: DC
  Administered 2023-10-15 – 2023-10-19 (×11): 3 mL via INTRAVENOUS

## 2023-10-14 MED ORDER — POLYETHYLENE GLYCOL 3350 17 G PO PACK: 17.0000 g | PACK | Freq: Every day | ORAL | Status: DC | PRN

## 2023-10-14 MED ORDER — PROSIGHT PO TABS
1.0000 | ORAL_TABLET | Freq: Two times a day (BID) | ORAL | Status: DC
  Administered 2023-10-15 – 2023-10-20 (×12): 1 via ORAL
  Filled 2023-10-14 (×13): qty 1

## 2023-10-14 MED ORDER — LEVOTHYROXINE SODIUM 112 MCG PO TABS
112.0000 ug | ORAL_TABLET | Freq: Every day | ORAL | Status: DC
  Administered 2023-10-15: 112 ug via ORAL
  Filled 2023-10-14: qty 1

## 2023-10-14 MED ORDER — DILTIAZEM LOAD VIA INFUSION
15.0000 mg | Freq: Once | INTRAVENOUS | Status: AC
  Administered 2023-10-14: 15 mg via INTRAVENOUS
  Filled 2023-10-14: qty 15

## 2023-10-14 MED ORDER — APIXABAN 2.5 MG PO TABS
2.5000 mg | ORAL_TABLET | Freq: Two times a day (BID) | ORAL | Status: DC
  Administered 2023-10-14 – 2023-10-20 (×12): 2.5 mg via ORAL
  Filled 2023-10-14 (×12): qty 1

## 2023-10-14 NOTE — Assessment & Plan Note (Signed)
Symptomatic UTI, with lower back and abdominal pain, increased urinary urgency and frequency.  No fever or dysuria. Recently received Macrobid by PCP.  UA positive for leukocytes, nitrites and bacteria.  Urine cultures with gram negative rods. -Continue with Rocephin -Follow-up urine cultures

## 2023-10-14 NOTE — Assessment & Plan Note (Signed)
Patient does take diltiazem at home.  Blood pressure currently elevated. -On diltiazem infusion -Continue to monitor

## 2023-10-14 NOTE — Assessment & Plan Note (Signed)
Patient had nonspecific lower back pain started about 2 weeks ago, patient recently completed the course of prednisone given by PCP. CT scan with punctate nonobstructive right nephrolithiasis, no hydronephrosis or ureteral calculi.  No acute fracture or dislocation. 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 No red flag symptoms. -Supportive care with pain management

## 2023-10-14 NOTE — Assessment & Plan Note (Signed)
Patient remained asymptomatic, heart rate improved to 90's No significant response with diltiazem bolus so started on diltiazem infusion.  Barely positive troponin with a flat curve.  BNP elevated at 635. Cardiology was consulted. -Another bolus was given by cardiology -Continue with diltiazem infusion -Continue with Eliquis -Echocardiogram with normal EF, indeterminate diastolic function and mildly dilated left atrium.  -Continue to monitor F/U cardiology recommendations.

## 2023-10-14 NOTE — Assessment & Plan Note (Signed)
 Check TSH -Continue home Synthroid

## 2023-10-14 NOTE — ED Triage Notes (Signed)
Pt sts that she recently finished taking an abx for a UTI and since than pt sts that she is now having back pain. Pt sts that she has also been nauseated.

## 2023-10-14 NOTE — ED Notes (Signed)
Pharmacy called at this time to send cardizem gtt due to none in [place].

## 2023-10-14 NOTE — ED Notes (Signed)
Blood cultures and full rainbow sent to lab

## 2023-10-14 NOTE — H&P (Signed)
History and Physical    Patient: Katrina Beasley ZOX:[id number] DOB: July 21, 1941 DOA: 10/14/2023 DOS: the patient was seen and examined on 10/14/2023 PCP: Mick Sell, MD  Patient coming from: ALF/[facility]  Chief Complaint:  Chief Complaint  Patient presents with   Back Pain   HPI: Katrina Beasley is a 82 y.o. female with medical history significant of paroxysmal atrial fibrillation on Eliquis, IBS and hypothyroidism presented to ED with lower back pain.  Patient is having back pain, lower abdominal pain with some increased urinary urgency and frequency for the past 2 weeks.  She saw her PCP on 12/16 and was given course of steroid and Macrobid.  Continue to have lower back pain so came to ED for evaluation.  Patient denies any chest pain, palpitations, shortness of breath.  Occasionally having mild nausea but no vomiting.  No recent change in bowel habit or appetite.  No upper respiratory symptoms.  No sick contacts.  No fever or chills.  ED course and data reviewed.  On arrival patient was found to be in A-fib with RVR, heart rate between 130s to 150s.  Blood pressure mildly elevated.  No leukocytosis but labs appear hemoconcentrated, sodium 132, elevated total protein at 8.8, albumin 5.2, T. bili 2.1, troponin 20>22, UA with mild ketone and protein urea along with positive nitrite, trace leukocytes and many bacteria, urine cultures were sent. CT renal stone study and L-spine with some nonobstructing left nephrolithiasis but no obstruction or ureteral stone. Moderate to advanced degenerative changes with no acute fractures or dislocations. 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  Patient did not respond to initial fluid and diltiazem bolus so started on diltiazem infusion and cardiology was consulted.   Review of Systems: As mentioned in the history of present illness. All other systems reviewed and are negative. No past medical history on  file. Past Surgical History:  Procedure Laterality Date   BREAST BIOPSY Left 1980   needle bx x 2 Benign per pt   Social History:  reports that she has never smoked. She has never used smokeless tobacco. No history on file for alcohol use and drug use.  Allergies  Allergen Reactions   Cyclosporine Other (See Comments) and Rash    Burning   Sulfa Antibiotics Itching   Azithromycin Diarrhea    Exfoliative mucosal rash.    Family History  Problem Relation Age of Onset   Breast cancer Mother 67    Prior to Admission medications   Medication Sig Start Date End Date Taking? Authorizing Provider  nitrofurantoin, macrocrystal-monohydrate, (MACROBID) 100 MG capsule Take 100 mg by mouth 2 (two) times daily. 10/06/23  Yes [provider]  ondansetron (ZOFRAN-ODT) 4 MG disintegrating tablet Take 4 mg by mouth every 8 (eight) hours as needed. 10/06/23  Yes [provider]  brimonidine-timolol (COMBIGAN) 0.2-0.5 % ophthalmic solution Place 1 drop into both eyes every 12 (twelve) hours.    [provider]  diltiazem (CARDIZEM) 90 MG tablet Take 90 mg by mouth at bedtime. 04/14/23 04/13/24  [provider]  ELIQUIS 5 MG TABS tablet Take 5 mg by mouth 2 (two) times daily.    [provider]  latanoprost (XALATAN) 0.005 % ophthalmic solution Place 1 drop into both eyes at bedtime. 06/09/17   [provider]  levothyroxine (SYNTHROID) 112 MCG tablet Take 125 mcg by mouth daily before breakfast.    [provider]  Multiple Vitamins-Minerals (PRESERVISION AREDS 2+MULTI VIT PO) Take  by mouth 2 (two) times daily.    [provider]  nystatin (MYCOSTATIN/NYSTOP) powder Apply 1 Application topically 3 (three) times daily. 05/09/23   Earnestine Mealing, MD  predniSONE (DELTASONE) 20 MG tablet Take 2 tablets (40 mg total) by mouth daily with breakfast. 05/12/23   Earnestine Mealing, MD  Zinc Oxide (DESITIN) 40 % PSTE Apply 1 Application topically  daily. 05/09/23   Earnestine Mealing, MD    Physical Exam: Vitals:   10/14/23 1330 10/14/23 1400 10/14/23 1500 10/14/23 1555  BP: (!) 153/87 (!) 156/127 (!) 169/130 (!) 176/107  Pulse: 86 (!) 158 (!) 154 (!) 131  Resp: 19 (!) 25 20 (!) 24  Temp:      TempSrc:      SpO2: 100% 100% 96% 100%  Weight:      Height:        General: Vital signs reviewed.  Patient is well-developed and well-nourished, in no acute distress and cooperative with exam.  Head: Normocephalic and atraumatic. Eyes: EOMI, conjunctivae normal, no scleral icterus.  Neck: Supple, trachea midline, normal ROM,  Cardiovascular: Irregularly irregular with tachycardia Pulmonary/Chest: Clear to auscultation bilaterally, no wheezes, rales, or rhonchi. Abdominal: Soft, non-tender, non-distended, BS +, Extremities: No lower extremity edema bilaterally,  pulses symmetric and intact bilaterally. No cyanosis or clubbing. Neurological: A&O x3, Strength is normal and symmetric bilaterally, cranial nerve II-XII are grossly intact, no focal motor deficit, sensory intact to light touch bilaterally.  Skin: Warm, dry and intact. No rashes or erythema. Psychiatric: Normal mood and affect.   Data Reviewed: Prior data reviewed as mentioned above  Assessment and Plan: * Atrial fibrillation with RVR (HCC) Patient remained asymptomatic, heart rate between 130s to 150s. No significant response with diltiazem bolus so started on diltiazem infusion.  Barely positive troponin with a flat curve.  BNP elevated at 635. Cardiology was consulted. -Admit to progressive care -Continue with diltiazem infusion -Continue with Eliquis -Echocardiogram ordered  UTI (urinary tract infection) Symptomatic UTI, with lower back and abdominal pain, increased urinary urgency and frequency.  No fever or dysuria. Recently received Macrobid by PCP.  UA positive for leukocytes, nitrites and bacteria.  Urine cultures pending -Started on Rocephin -Follow-up urine  cultures  Essential hypertension Patient does take diltiazem at home.  Blood pressure currently elevated. -On diltiazem infusion -Continue to monitor  Back pain Patient had nonspecific lower back pain started about 2 weeks ago, patient recently completed the course of prednisone given by PCP. CT scan with punctate nonobstructive right nephrolithiasis, no hydronephrosis or ureteral calculi.  No acute fracture or dislocation. 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 No red flag symptoms. -Supportive care with pain management  Hypothyroidism Check TSH. -Continue home Synthroid    Advance Care Planning:   Code Status: Full Code discussed with patient  Consults: Cardiology  Family Communication: No family at bedside  Severity of Illness: The appropriate patient status for this patient is OBSERVATION. Observation status is judged to be reasonable and necessary in order to provide the required intensity of service to ensure the patient's safety. The patient's presenting symptoms, physical exam findings, and initial radiographic and laboratory data in the context of their medical condition is felt to place them at decreased risk for further clinical deterioration. Furthermore, it is anticipated that the patient will be medically stable for discharge from the hospital within 2 midnights of admission.   This record has been created using Conservation officer, historic buildings. Errors have been sought and corrected,but may not always  be located. Such creation errors do not reflect on the standard of care.   Author: Arnetha Courser, MD 10/14/2023 4:13 PM  For on call review [email or web address].

## 2023-10-14 NOTE — ED Provider Notes (Addendum)
[facility] Provider Note    Event Date/Time   First MD Initiated Contact with Patient 10/14/23 1258     (approximate)  History   Chief Complaint: Back Pain  HPI  Katrina Beasley is a 82 y.o. female who presents to the emergency department for nausea and rapid heart rate.  According to the patient approximate 1.5 weeks ago she was experiencing some back pain which she describes as worse with movement especially bending twisting or turning.  She was seen by her doctor who prescribed her prednisone as well as a antibiotic for urinary tract infection.  Patient states she has completed both of these medications several days ago continues to have some back pain.  Patient also states nausea found to be in atrial fibrillation with rapid ventricular response around 150 bpm upon arrival to the emergency department.  Patient has a history of atrial fibrillation for which she takes diltiazem as well as Eliquis.  Has not missed any doses of these medications per patient.  Patient denies any chest pain denies any palpitations or shortness of breath.  Physical Exam   Triage Vital Signs: ED Triage Vitals  Encounter Vitals Group     BP 10/14/23 1244 (!) 176/131     Systolic BP Percentile --      Diastolic BP Percentile --      Pulse Rate 10/14/23 1244 (!) 156     Resp 10/14/23 1244 17     Temp 10/14/23 1244 98.4 F (36.9 C)     Temp Source 10/14/23 1244 Oral     SpO2 10/14/23 1244 99 %     Weight 10/14/23 1241 127 lb (57.6 kg)     Height 10/14/23 1241 5\' 5"  (1.651 m)     Head Circumference --      Peak Flow --      Pain Score 10/14/23 1240 8     Pain Loc --      Pain Education --      Exclude from Growth Chart --     Most recent vital signs: Vitals:   10/14/23 1308 10/14/23 1317  BP:  (!) 121/95  Pulse: (!) 130 (!) 139  Resp: 12 17  Temp:    SpO2: 100% 100%    General: Awake, no distress.  CV:  Good peripheral perfusion.  Regular rate and rhythm   Resp:  Normal effort.  Equal breath sounds bilaterally.  Abd:  No distention.  Soft, nontender.  No rebound or guarding.  ED Results / Procedures / Treatments   EKG  EKG viewed and interpreted by myself shows atrial fibrillation at 149 bpm with a narrow QRS, left axis deviation, largely normal intervals with nonspecific ST changes no ST elevation.  RADIOLOGY  I have reviewed interpret the CT images.  No significant finding on my evaluation. Radiology is read the CT scan as no acute finding within the abdomen or pelvis.  There is degenerative disc disease throughout the lumbar spine and possible pulmonary fibrosis.   MEDICATIONS ORDERED IN ED: Medications  sodium chloride 0.9 % bolus 500 mL (500 mLs Intravenous New Bag/Given 10/14/23 1315)  diltiazem (CARDIZEM) injection 10 mg (10 mg Intravenous Given 10/14/23 1315)     IMPRESSION / MDM / ASSESSMENT AND PLAN / ED COURSE  I reviewed the triage vital signs and the nursing notes.  Patient's presentation is most consistent with acute presentation with potential threat to life or bodily function.  Patient presents to the emergency department for back  pain nausea found to be in atrial fibrillation with rapid ventricular response.  According to the patient for the past several weeks she has been experiencing pain in her lower back worse with movement especially twisting turning or bending.  Patient recently completed a course of prednisone but states continues to have pain so she came to the emergency department.  Patient was also recently treated for urinary tract infection and finished a course of antibiotics.  Denies any urinary symptoms.  Upon arrival patient found to be in atrial fibrillation with rapid ventricular response around 150 bpm.  Patient has a history of of atrial fibrillation and is on oral diltiazem and Eliquis at home.  We will check labs including a CBC chemistry and troponin.  Will obtain urinalysis to ensure that the  urinary tract infection has been adequately treated.  We will dose IV diltiazem and IV fluids.  Given the patient's continued back pain we will also obtain CT imaging of the back and abdomen as a precaution.  Patient agreeable to plan of care.  Differential would include musculoskeletal pain in addition to ureterolithiasis or other intra-abdominal pathology or metastatic disease.  Patient's CT imaging shows no significant acute finding.  However patient remains in atrial fibrillation with RVR on last check 130 bpm (recorded pulse rate of 86 is not accurate as this is a pulse ox rate).  Will start the patient on diltiazem infusion.  Troponin slightly elevated again likely due to demand ischemia.  Will admit to the hospital service for continued antibiotics for the patient's urinary tract infection I have sent a urine culture.  Patient is currently on diltiazem infusion.  CRITICAL CARE Performed by: Minna Antis   Total critical care time: 30 minutes  Critical care time was exclusive of separately billable procedures and treating other patients.  Critical care was necessary to treat or prevent imminent or life-threatening deterioration.  Critical care was time spent personally by me on the following activities: development of treatment plan with patient and/or surrogate as well as nursing, discussions with consultants, evaluation of patient's response to treatment, examination of patient, obtaining history from patient or surrogate, ordering and performing treatments and interventions, ordering and review of laboratory studies, ordering and review of radiographic studies, pulse oximetry and re-evaluation of patient's condition.   FINAL CLINICAL IMPRESSION(S) / ED DIAGNOSES   Back pain Atrial fibrillation with rapid ventricular response   Note:  This document was prepared using Dragon voice recognition software and may include unintentional dictation errors.   Minna Antis,  MD 10/14/23 1450    Minna Antis, MD 10/14/23 1459

## 2023-10-14 NOTE — Hospital Course (Addendum)
Katrina Beasley is a 82 y.o. female with medical history significant of paroxysmal atrial fibrillation on Eliquis, IBS and hypothyroidism presented to ED with lower back pain.   Found to be in A-fib with RVR which did not responded initially to IV fluid or diltiazem bolus.  UA concerning for UTI so started on Rocephin and diltiazem infusion.  Cardiology was consulted.  12/25: Heart rate improved to 90s.  Mild leukocytosis today likely reactive, TSH elevated at 9.948-increasing the Synthroid dose from 112-125 and her PCP can repeat the level and adjust doses as appropriate in 4 weeks. Echocardiogram with normal EF, indeterminate diastolic function and mildly dilated left atrium.  She was given another bolus of diltiazem, intermittent concern of atrial flutter.  Cardiology is on board.  12/26; heart rate remained elevated, cardiology added metoprolol and flecainide.  Zofran was not helping with nausea so switched with Phenergan. Remained on diltiazem infusion.  Cardiology might consider DCCV as outpatient if needed.  Urine cultures with Klebsiella pneumonia, resistant to ampicillin, Augmentin and nitrofurantoin.  Remain on ceftriaxone.  12/27: Remained in A-fib with improved heart rate.  Cardiology switched to p.o. Cardizem at 30 mg every 6 hourly, metoprolol 50 mg twice daily and continuing flecainide 50 mg twice daily.  Ceftriaxone switched with Keflex  12/28; heart rate improved while resting, to increase to low 100s with exertion and mobility.  Cardiology increased her flecainide to 100 mg twice daily.  Patient might need DCCV and EP evaluation which can be done as outpatient.  12/29: Heart rate seems improving, mostly remained just below 100.  Remained asymptomatic.  Patient was quite frustrated with the care as she remained in A-fib.  Completed the course of antibiotic.  She wants to discuss with [name] tomorrow morning before going home  12/30: Heart rate remained  controlled, remained in A-fib.  Completed the course of antibiotic.  Cardiology cleared for discharge on 120 mg of Cardizem with flecainide and metoprolol and they will follow-up closely as outpatient.  If patient does not converted back to sinus rhythm with flecainide they might consider DCCV.  She is also being discharged higher doses of Synthroid due to elevated TSH and need a repeat TSH level in 3 to 4 weeks by PCP.  Patient will continue on current medications and need to have a close follow-up with her providers for further management.

## 2023-10-14 NOTE — Consult Note (Signed)
[facility] Cardiology  CARDIOLOGY CONSULT NOTE  Patient ID: Katrina Beasley MRN: [id number] DOB/AGE: Feb 13, 1941 82 y.o.  Admit date: 10/14/2023 Referring Physician Nelson Chimes Primary Physician [facility] Primary Cardiologist [facility] Reason for Consultation atrial fibrillation  HPI: 82 year old female referred for evaluation of atrial fibrillation.  The patient has a history of paroxysmal atrial fibrillation, on Eliquis for stroke prevention, and diltiazem 90 mg at night for rate and rhythm control.  The patient has been in her usual state of health till the past few days where she has experienced increase low back pain and nausea.  The patient saw her primary care provider who prescribed prednisone, as well as, an antibiotic for urinary tract infection.  Patient presents to the emergency room where she was noted to be tachycardic.  ECG revealed atrial fibrillation at a rate of 149 bpm.  Patient denies chest pain, shortness of breath, palpitations or heart racing.  She has received initial dose of diltiazem IV 10 mg and started on a diltiazem drip at 5 mg/h.  Patient remains tachycardic and hypertensive.  Admission labs reveal borderline elevated high-sensitivity troponin (19, 22), in the absence of chest pain.  Review of systems complete and found to be negative unless listed above     No past medical history on file.  Past Surgical History:  Procedure Laterality Date   BREAST BIOPSY Left 1980   needle bx x 2 Benign per pt    (Not in a hospital admission)  Social History   Socioeconomic History   Marital status: Single    Spouse name: Not on file   Number of children: Not on file   Years of education: Not on file   Highest education level: Not on file  Occupational History   Not on file  Tobacco Use   Smoking status: Never   Smokeless tobacco: Never  Substance and Sexual Activity   Alcohol use: Not on file   Drug use: Not on file   Sexual activity: Not on file  Other Topics Concern   Not  on file  Social History Narrative   Not on file   Social Drivers of Health   Financial Resource Strain: Low Risk  (10/06/2023)   Received from [facility]   Overall Financial Resource Strain (CARDIA)    Difficulty of Paying Living Expenses: Not hard at all  Food Insecurity: No Food Insecurity (10/06/2023)   Received from [facility]   Hunger Vital Sign    Worried About Running Out of Food in the Last Year: Never true    Ran Out of Food in the Last Year: Never true  Transportation Needs: No Transportation Needs (10/06/2023)   Received from [facility] - Transportation    In the past 12 months, has lack of transportation kept you from medical appointments or from getting medications?: No    Lack of Transportation (Non-Medical): No  Physical Activity: Not on file  Stress: Not on file  Social Connections: Unknown (03/04/2022)   Received from [facility]   Social Network    Social Network: Not on file  Intimate Partner Violence: Unknown (01/24/2022)   Received from [facility]   HITS    Physically Hurt: Not on file    Insult or Talk Down To: Not on file    Threaten Physical Harm: Not on file    Scream or Curse: Not on file    Family History  Problem Relation Age of  Onset   Breast cancer Mother 65      Review of systems complete and found to be negative unless listed above      PHYSICAL EXAM  General: Well developed, well nourished, in no acute distress HEENT:  Normocephalic and atramatic Neck:  No JVD.  Lungs: Clear bilaterally to auscultation and percussion. Heart: HRRR . Normal S1 and S2 without gallops or murmurs.  Abdomen: Bowel sounds are positive, abdomen soft and non-tender  Msk:  Back normal, normal gait. Normal strength and tone for age. Extremities: No clubbing, cyanosis or edema.   Neuro: Alert and oriented X 3. Psych:  Good affect, responds  appropriately  Labs:   Lab Results  Component Value Date   WBC 9.9 10/14/2023   HGB 16.5 (H) 10/14/2023   HCT 49.7 (H) 10/14/2023   MCV 92.7 10/14/2023   PLT 304 10/14/2023    Recent Labs  Lab 10/14/23 1306  NA 132*  K 3.5  CL 95*  CO2 23  BUN 20  CREATININE 0.93  CALCIUM 9.8  PROT 8.8*  BILITOT 2.1*  ALKPHOS 121  ALT 26  AST 22  GLUCOSE 108*   No results found for: "CKTOTAL", "CKMB", "CKMBINDEX", "TROPONINI" No results found for: "CHOL" No results found for: "HDL" No results found for: "LDLCALC" No results found for: "TRIG" No results found for: "CHOLHDL" No results found for: "LDLDIRECT"    Radiology: CT Renal Stone Study Result Date: 10/14/2023 CLINICAL DATA:  Back pain, recent UTI, stone suspected EXAM: CT ABDOMEN AND PELVIS WITHOUT CONTRAST LUMBAR SPINE WITHOUT CONTRAST TECHNIQUE: Multidetector CT imaging of the abdomen and pelvis was performed following the standard protocol without IV contrast. Multidetector CT imaging of the lumbar spine was performed following the standard protocol without IV contrast. RADIATION DOSE REDUCTION: This exam was performed according to the departmental dose-optimization program which includes automated exposure control, adjustment of the mA and/or kV according to patient size and/or use of iterative reconstruction technique. COMPARISON:  None Available. FINDINGS: CT ABDOMEN PELVIS FINDINGS Lower chest: No acute findings. Cardiomegaly. 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 Hepatobiliary: No focal liver abnormality is seen. Status post cholecystectomy. No biliary dilatation. Pancreas: Unremarkable. No pancreatic ductal dilatation or surrounding inflammatory changes. Spleen: Normal in size without significant abnormality. Adrenals/Urinary Tract: Adrenal glands are unremarkable. Simple, benign renal cortical cysts, for which no further follow-up or characterization is required. Punctuate  nonobstructive calculus of the midportion of the right kidney (series 5, image 49). No ureteral calculi or hydronephrosis. Bladder is unremarkable. Stomach/Bowel: Stomach is within normal limits. Appendix appears normal. No evidence of bowel wall thickening, distention, or inflammatory changes. Status post sigmoid colon resection and reanastomosis. Vascular/Lymphatic: Aortic atherosclerosis. No enlarged abdominal or pelvic lymph nodes. Reproductive: Status post hysterectomy. Other: No abdominal wall hernia or abnormality. No ascites. Musculoskeletal: No acute osseous findings. CT LUMBAR SPINE FINDINGS Alignment: Degenerative straightening of the normal lumbar lordosis. Dextroscoliosis of the lumbar spine, apex L3. Vertebral bodies: Intact. No fracture or dislocation. 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 Moderate to severe multilevel lumbar facet degenerative change, worst on the left. Paraspinous soft tissues: Unremarkable. IMPRESSION: 1. Punctuate nonobstructive calculus of the midportion of the right kidney. No ureteral calculi or hydronephrosis. 2. No acute noncontrast CT findings of the abdomen or pelvis. 3. No fracture or dislocation of the lumbar spine. 4. 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 Lumbar disc and  neural foraminal pathology may be further evaluated by MRI if indicated by neurologically localizing signs and symptoms. 5. 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 Consider pulmonary referral and dedicated interstitial lung disease protocol CT of the chest for further evaluation on a nonemergent, outpatient basis if clinically appropriate. 6. Cardiomegaly. Aortic Atherosclerosis (ICD10-I70.0). Electronically Signed   By: Jearld Lesch M.D.   On: 10/14/2023 14:35   CT L-SPINE NO CHARGE Result Date: 10/14/2023 CLINICAL DATA:  Back pain, recent UTI,  stone suspected EXAM: CT ABDOMEN AND PELVIS WITHOUT CONTRAST LUMBAR SPINE WITHOUT CONTRAST TECHNIQUE: Multidetector CT imaging of the abdomen and pelvis was performed following the standard protocol without IV contrast. Multidetector CT imaging of the lumbar spine was performed following the standard protocol without IV contrast. RADIATION DOSE REDUCTION: This exam was performed according to the departmental dose-optimization program which includes automated exposure control, adjustment of the mA and/or kV according to patient size and/or use of iterative reconstruction technique. COMPARISON:  None Available. FINDINGS: CT ABDOMEN PELVIS FINDINGS Lower chest: No acute findings. Cardiomegaly. 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 Hepatobiliary: No focal liver abnormality is seen. Status post cholecystectomy. No biliary dilatation. Pancreas: Unremarkable. No pancreatic ductal dilatation or surrounding inflammatory changes. Spleen: Normal in size without significant abnormality. Adrenals/Urinary Tract: Adrenal glands are unremarkable. Simple, benign renal cortical cysts, for which no further follow-up or characterization is required. Punctuate nonobstructive calculus of the midportion of the right kidney (series 5, image 49). No ureteral calculi or hydronephrosis. Bladder is unremarkable. Stomach/Bowel: Stomach is within normal limits. Appendix appears normal. No evidence of bowel wall thickening, distention, or inflammatory changes. Status post sigmoid colon resection and reanastomosis. Vascular/Lymphatic: Aortic atherosclerosis. No enlarged abdominal or pelvic lymph nodes. Reproductive: Status post hysterectomy. Other: No abdominal wall hernia or abnormality. No ascites. Musculoskeletal: No acute osseous findings. CT LUMBAR SPINE FINDINGS Alignment: Degenerative straightening of the normal lumbar lordosis. Dextroscoliosis of the lumbar spine, apex L3. Vertebral  bodies: Intact. No fracture or dislocation. 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 Moderate to severe multilevel lumbar facet degenerative change, worst on the left. Paraspinous soft tissues: Unremarkable. IMPRESSION: 1. Punctuate nonobstructive calculus of the midportion of the right kidney. No ureteral calculi or hydronephrosis. 2. No acute noncontrast CT findings of the abdomen or pelvis. 3. No fracture or dislocation of the lumbar spine. 4. 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 Lumbar disc and neural foraminal pathology may be further evaluated by MRI if indicated by neurologically localizing signs and symptoms. 5. 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 Consider pulmonary referral and dedicated interstitial lung disease protocol CT of the chest for further evaluation on a nonemergent, outpatient basis if clinically appropriate. 6. Cardiomegaly. Aortic Atherosclerosis (ICD10-I70.0). Electronically Signed   By: Jearld Lesch M.D.   On: 10/14/2023 14:35    EKG: Atrial fibrillation at 149 bpm  ASSESSMENT AND PLAN:   1.  Atrial fibrillation with rapid ventricular rate, and patient with known history of paroxysmal atrial fibrillation, on Eliquis for stroke prevention, and diltiazem 90 mg nightly for rate and rhythm control, remains tachycardic after diltiazem IV 10 mg bolus and diltiazem drip at 5 mg/h 2.  Essential hypertension, blood pressure elevated 3.  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  Borderline elevated high-sensitivity troponin (19, 22), in the setting of atrial fibrillation with rapid ventricular rate, chronic low back  pain, and elevated blood pressure, likely demand supply ischemia  Recommendations  1.  Resume Eliquis 5 mg twice daily for stroke prevention 2.  Additional diltiazem  IV 15 mg bolus 3.  Uptitrate diltiazem drip to 10 mg/h 4.  Review 2D echocardiogram 5.  Further recommendations pending patient's initial clinical course  Signed: Marcina Millard MD,PhD, [facility] 10/14/2023, 3:43 PM

## 2023-10-14 NOTE — ED Notes (Signed)
Pt moved room 257 telemetry.

## 2023-10-15 ENCOUNTER — Encounter: Payer: Self-pay | Admitting: Internal Medicine

## 2023-10-15 ENCOUNTER — Observation Stay
Admit: 2023-10-15 | Discharge: 2023-10-15 | Disposition: A | Discharge status: Home / Self Care | Payer: Medicare Other | Attending: Internal Medicine

## 2023-10-15 DIAGNOSIS — I4891 Unspecified atrial fibrillation: Secondary | ICD-10-CM

## 2023-10-15 DIAGNOSIS — I2489 Other forms of acute ischemic heart disease: Secondary | ICD-10-CM | POA: Diagnosis present

## 2023-10-15 DIAGNOSIS — Z882 Allergy status to sulfonamides status: Secondary | ICD-10-CM | POA: Diagnosis not present

## 2023-10-15 DIAGNOSIS — N3000 Acute cystitis without hematuria: Secondary | ICD-10-CM | POA: Diagnosis not present

## 2023-10-15 DIAGNOSIS — I10 Essential (primary) hypertension: Secondary | ICD-10-CM | POA: Diagnosis present

## 2023-10-15 DIAGNOSIS — M545 Low back pain, unspecified: Secondary | ICD-10-CM

## 2023-10-15 DIAGNOSIS — Z881 Allergy status to other antibiotic agents status: Secondary | ICD-10-CM | POA: Diagnosis not present

## 2023-10-15 DIAGNOSIS — Z6820 Body mass index (BMI) 20.0-20.9, adult: Secondary | ICD-10-CM | POA: Diagnosis not present

## 2023-10-15 DIAGNOSIS — N390 Urinary tract infection, site not specified: Secondary | ICD-10-CM | POA: Diagnosis present

## 2023-10-15 DIAGNOSIS — R9431 Abnormal electrocardiogram [ECG] [EKG]: Secondary | ICD-10-CM

## 2023-10-15 DIAGNOSIS — Z1629 Resistance to other single specified antibiotic: Secondary | ICD-10-CM | POA: Diagnosis present

## 2023-10-15 DIAGNOSIS — E039 Hypothyroidism, unspecified: Secondary | ICD-10-CM

## 2023-10-15 DIAGNOSIS — I480 Paroxysmal atrial fibrillation: Secondary | ICD-10-CM | POA: Diagnosis present

## 2023-10-15 DIAGNOSIS — Z1611 Resistance to penicillins: Secondary | ICD-10-CM | POA: Diagnosis present

## 2023-10-15 DIAGNOSIS — I776 Arteritis, unspecified: Secondary | ICD-10-CM | POA: Diagnosis present

## 2023-10-15 DIAGNOSIS — N200 Calculus of kidney: Secondary | ICD-10-CM | POA: Diagnosis present

## 2023-10-15 DIAGNOSIS — Z803 Family history of malignant neoplasm of breast: Secondary | ICD-10-CM | POA: Diagnosis not present

## 2023-10-15 DIAGNOSIS — Z7901 Long term (current) use of anticoagulants: Secondary | ICD-10-CM | POA: Diagnosis not present

## 2023-10-15 DIAGNOSIS — R54 Age-related physical debility: Secondary | ICD-10-CM | POA: Diagnosis present

## 2023-10-15 DIAGNOSIS — M51369 Other intervertebral disc degeneration, lumbar region without mention of lumbar back pain or lower extremity pain: Secondary | ICD-10-CM | POA: Diagnosis present

## 2023-10-15 DIAGNOSIS — J8410 Pulmonary fibrosis, unspecified: Secondary | ICD-10-CM | POA: Diagnosis present

## 2023-10-15 DIAGNOSIS — G8929 Other chronic pain: Secondary | ICD-10-CM | POA: Diagnosis present

## 2023-10-15 DIAGNOSIS — B961 Klebsiella pneumoniae [K. pneumoniae] as the cause of diseases classified elsewhere: Secondary | ICD-10-CM | POA: Diagnosis present

## 2023-10-15 DIAGNOSIS — Z79899 Other long term (current) drug therapy: Secondary | ICD-10-CM | POA: Diagnosis not present

## 2023-10-15 DIAGNOSIS — Z79890 Hormone replacement therapy (postmenopausal): Secondary | ICD-10-CM | POA: Diagnosis not present

## 2023-10-15 LAB — ECHOCARDIOGRAM COMPLETE
AR max vel: 1.08 cm2
AV Area VTI: 1.28 cm2
AV Area mean vel: 1.05 cm2
AV Mean grad: 10.5 mm[Hg]
AV Peak grad: 19.4 mm[Hg]
Ao pk vel: 2.21 m/s
Area-P 1/2: 3.77 cm2
Height: 65 in
MV VTI: 2.21 cm2
S' Lateral: 2.7 cm
Weight: 1904 [oz_av]

## 2023-10-15 LAB — BASIC METABOLIC PANEL
Anion gap: 12 (ref 5–15)
BUN: 13 mg/dL (ref 8–23)
CO2: 22 mmol/L (ref 22–32)
Calcium: 9 mg/dL (ref 8.9–10.3)
Chloride: 102 mmol/L (ref 98–111)
Creatinine, Ser: 0.66 mg/dL (ref 0.44–1.00)
GFR, Estimated: >60 mL/min (ref >60)
Glucose, Bld: 122 mg/dL — ABNORMAL HIGH (ref 70–99)
Potassium: 4.2 mmol/L (ref 3.5–5.1)
Sodium: 136 mmol/L (ref 135–145)

## 2023-10-15 LAB — CBC
HCT: 44.9 % (ref 36.0–46.0)
Hemoglobin: 15.2 g/dL — ABNORMAL HIGH (ref 12.0–15.0)
MCH: 30.1 pg (ref 26.0–34.0)
MCHC: 33.9 g/dL (ref 30.0–36.0)
MCV: 88.9 fL (ref 80.0–100.0)
Platelets: 179 10*3/uL (ref 150–400)
RBC: 5.05 MIL/uL (ref 3.87–5.11)
RDW: 12.6 % (ref 11.5–15.5)
WBC: 11 10*3/uL — ABNORMAL HIGH (ref 4.0–10.5)
nRBC: 0 % (ref 0.0–0.2)

## 2023-10-15 LAB — GLUCOSE, CAPILLARY
Glucose-Capillary: 101 mg/dL — ABNORMAL HIGH (ref 70–99)
Glucose-Capillary: 121 mg/dL — ABNORMAL HIGH (ref 70–99)
Glucose-Capillary: 110 mg/dL — ABNORMAL HIGH (ref 70–99)

## 2023-10-15 LAB — MRSA NEXT GEN BY PCR, NASAL: MRSA by PCR Next Gen: NOT DETECTED

## 2023-10-15 MED ORDER — SODIUM CHLORIDE 0.9 % IV BOLUS
500.0000 mL | Freq: Once | INTRAVENOUS | Status: AC
  Administered 2023-10-15: 500 mL via INTRAVENOUS

## 2023-10-15 MED ORDER — LEVOTHYROXINE SODIUM 25 MCG PO TABS
125.0000 ug | ORAL_TABLET | Freq: Every day | ORAL | Status: DC
  Administered 2023-10-16 – 2023-10-20 (×5): 125 ug via ORAL
  Filled 2023-10-15 (×5): qty 1

## 2023-10-15 MED ORDER — SODIUM CHLORIDE 0.9 % IV SOLN
1.0000 g | INTRAVENOUS | Status: DC
  Administered 2023-10-15 – 2023-10-16 (×2): 1 g via INTRAVENOUS
  Filled 2023-10-15 (×3): qty 10

## 2023-10-15 NOTE — Progress Notes (Signed)
SUBJECTIVE: Patient is sitting and in bed eating breakfast.  Denies any chest pain shortness of breath or palpitation.  States she came in because of back pain and UTI.   Vitals:   10/15/23 0334 10/15/23 0400 10/15/23 0500 10/15/23 0802  BP:   139/77   Pulse:  89 90   Resp:  16 16   Temp:   98.1 F (36.7 C) 97.6 F (36.4 C)  TempSrc:   Oral Oral  SpO2: 93% 98% 96%   Weight:   54 kg   Height:        Intake/Output Summary (Last 24 hours) at 10/15/2023 0937 Last data filed at 10/15/2023 0655 Gross per 24 hour  Intake 346.69 ml  Output 600 ml  Net -253.31 ml    LABS: Basic Metabolic Panel: Recent Labs    10/14/23 1306 10/15/23 0447  NA 132* 136  K 3.5 4.2  CL 95* 102  CO2 23 22  GLUCOSE 108* 122*  BUN 20 13  CREATININE 0.93 0.66  CALCIUM 9.8 9.0   Liver Function Tests: Recent Labs    10/14/23 1306  AST 22  ALT 26  ALKPHOS 121  BILITOT 2.1*  PROT 8.8*  ALBUMIN 5.2*   No results for input(s): "LIPASE", "AMYLASE" in the last 72 hours. CBC: Recent Labs    10/14/23 1306 10/15/23 0447  WBC 9.9 11.0*  HGB 16.5* 15.2*  HCT 49.7* 44.9  MCV 92.7 88.9  PLT 304 179   Cardiac Enzymes: No results for input(s): "CKTOTAL", "CKMB", "CKMBINDEX", "TROPONINI" in the last 72 hours. BNP: Invalid input(s): "POCBNP" D-Dimer: No results for input(s): "DDIMER" in the last 72 hours. Hemoglobin A1C: No results for input(s): "HGBA1C" in the last 72 hours. Fasting Lipid Panel: No results for input(s): "CHOL", "HDL", "LDLCALC", "TRIG", "CHOLHDL", "LDLDIRECT" in the last 72 hours. Thyroid Function Tests: Recent Labs    10/14/23 1306  TSH 9.948*   Anemia Panel: No results for input(s): "VITAMINB12", "FOLATE", "FERRITIN", "TIBC", "IRON", "RETICCTPCT" in the last 72 hours.   PHYSICAL EXAM General: Well developed, well nourished, in no acute distress HEENT:  Normocephalic and atramatic Neck:  No JVD.  Lungs: Clear bilaterally to auscultation and percussion. Heart: HRRR  . Normal S1 and S2 without gallops or murmurs.  Abdomen: Bowel sounds are positive, abdomen soft and non-tender  Msk:  Back normal, normal gait. Normal strength and tone for age. Extremities: No clubbing, cyanosis or edema.   Neuro: Alert and oriented X 3. Psych:  Good affect, responds appropriately  TELEMETRY: A-fib with rapid ventricular response rate approximately 110/min  ASSESSMENT AND PLAN: 1.  Atrial fibrillation with rapid ventricular rate, and patient with known history of paroxysmal atrial fibrillation, on Eliquis for stroke prevention, and diltiazem 90 mg nightly for rate and rhythm control, remains tachycardic after diltiazem IV 10 mg bolus and diltiazem drip at 5-15mg /hr.  Patient is gradually improving her heart rate which is around 100 -115/min. 2.  Essential hypertension, blood pressure elevated 3.  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  Borderline elevated high-sensitivity troponin (19, 22), in the setting of atrial fibrillation with rapid ventricular rate, chronic low back pain, and elevated blood pressure, likely demand supply ischemia   Recommendations   1.  Resume Eliquis2.5 mg twice daily for stroke prevention 2.  Additional diltiazem IV 15 mg bolus 3.  Uptitrate diltiazem drip to 10 mg/h 4.  Review 2D echocardiogram    ICD-10-CM   1. Atrial  fibrillation with rapid ventricular response (HCC)  I48.91 Amb referral to AFIB Clinic    2. Urinary tract infection without hematuria, site unspecified  N39.0       Principal Problem:   Atrial fibrillation with RVR (HCC) Active Problems:   UTI (urinary tract infection)   Hypothyroidism   Essential hypertension   Back pain    Katrina Blackwater, MD, [facility] 10/15/2023 9:37 AM

## 2023-10-15 NOTE — Plan of Care (Signed)
  Problem: Clinical Measurements: Goal: Cardiovascular complication will be avoided Outcome: Progressing   Problem: Nutrition: Goal: Adequate nutrition will be maintained Outcome: Progressing   Problem: Coping: Goal: Level of anxiety will decrease Outcome: Progressing

## 2023-10-15 NOTE — Plan of Care (Signed)
  Problem: Education: Goal: Knowledge of General Education information will improve Description: Including pain rating scale, medication(s)/side effects and non-pharmacologic comfort measures Outcome: Progressing   Problem: Nutrition: Goal: Adequate nutrition will be maintained Outcome: Progressing   Problem: Coping: Goal: Level of anxiety will decrease Outcome: Progressing   Problem: Pain Management: Goal: General experience of comfort will improve Outcome: Progressing

## 2023-10-15 NOTE — Progress Notes (Signed)
*  PRELIMINARY RESULTS* Echocardiogram 2D Echocardiogram has been performed.  Katrina Beasley 10/15/2023, 9:57 AM

## 2023-10-15 NOTE — Plan of Care (Signed)
  Problem: Clinical Measurements: Goal: Ability to maintain clinical measurements within normal limits will improve Outcome: Progressing   Problem: Pain Management: Goal: General experience of comfort will improve Outcome: Progressing   Problem: Activity: Goal: Ability to tolerate increased activity will improve Outcome: Progressing

## 2023-10-15 NOTE — Progress Notes (Signed)
Progress Note   Patient: Katrina Beasley FAO:[id number] DOB: 12-Nov-1940 DOA: 10/14/2023     0 DOS: the patient was seen and examined on 10/15/2023   Brief hospital course: Katrina Beasley is a 82 y.o. female with medical history significant of paroxysmal atrial fibrillation on Eliquis, IBS and hypothyroidism presented to ED with lower back pain.   Found to be in A-fib with RVR which did not responded initially to IV fluid or diltiazem bolus.  UA concerning for UTI so started on Rocephin and diltiazem infusion.  Cardiology was consulted.  12/25: Heart rate improved to 90s.  Mild leukocytosis today likely reactive, TSH elevated at 9.948-increasing the Synthroid dose from 112-125 and her PCP can repeat the level and adjust doses as appropriate in 4 weeks. Echocardiogram with normal EF, indeterminate diastolic function and mildly dilated left atrium.  She was given another bolus of diltiazem, intermittent concern of atrial flutter.  Cardiology is on board.  Assessment and Plan: * Atrial fibrillation with RVR (HCC) Patient remained asymptomatic, heart rate improved to 90's No significant response with diltiazem bolus so started on diltiazem infusion.  Barely positive troponin with a flat curve.  BNP elevated at 635. Cardiology was consulted. -Another bolus was given by cardiology -Continue with diltiazem infusion -Continue with Eliquis -Echocardiogram with normal EF, indeterminate diastolic function and mildly dilated left atrium.  -Continue to monitor F/U cardiology recommendations.  UTI (urinary tract infection) Symptomatic UTI, with lower back and abdominal pain, increased urinary urgency and frequency.  No fever or dysuria. Recently received Macrobid by PCP.  UA positive for leukocytes, nitrites and bacteria.  Urine cultures with gram negative rods. -Continue with Rocephin -Follow-up urine cultures  Essential hypertension Patient does take diltiazem at home.  Blood pressure currently  elevated. -On diltiazem infusion -Continue to monitor  Back pain Patient had nonspecific lower back pain started about 2 weeks ago, patient recently completed the course of prednisone given by PCP. CT scan with punctate nonobstructive right nephrolithiasis, no hydronephrosis or ureteral calculi.  No acute fracture or dislocation. 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 No red flag symptoms. -Supportive care with pain management  Hypothyroidism TSH elevated -Increase the dose of synthroid to 125   Subjective: Patient denies any chest pain or palpitation.  She was concerned that if she is having asymptomatic A-fib and what type of monitoring she need.  Feeling weak.  Physical Exam: Vitals:   10/15/23 0400 10/15/23 0500 10/15/23 0802 10/15/23 1201  BP:  139/77  106/73  Pulse: 89 90    Resp: 16 16    Temp:  98.1 F (36.7 C) 97.6 F (36.4 C) 97.8 F (36.6 C)  TempSrc:  Oral Oral Oral  SpO2: 98% 96%    Weight:  54 kg    Height:       General.  Frail elderly lady, in no acute distress. Pulmonary.  Lungs clear bilaterally, normal respiratory effort. CV.  Irregularly irregular Abdomen.  Soft, nontender, nondistended, BS positive. CNS.  Alert and oriented .  No focal neurologic deficit. Extremities.  No edema, no cyanosis, pulses intact and symmetrical. Psychiatry.  Judgment and insight appears normal.   Data Reviewed: Prior data reviewed  Family Communication: Talked with daughter on phone.  Disposition: Status is: Inpatient Remains inpatient appropriate because: Severity of illness  Planned Discharge Destination: Home  DVT prophylaxis.  Eliquis Time spent: 50 minutes  This record has been created using Conservation officer, historic buildings. Errors have been sought and corrected,but may not always  be located. Such creation errors do not reflect on the standard of care.   Author: Arnetha Courser, MD 10/15/2023 2:00 PM  For on call review  [email or web address].

## 2023-10-16 DIAGNOSIS — I4891 Unspecified atrial fibrillation: Secondary | ICD-10-CM | POA: Diagnosis not present

## 2023-10-16 DIAGNOSIS — N3000 Acute cystitis without hematuria: Secondary | ICD-10-CM | POA: Diagnosis not present

## 2023-10-16 DIAGNOSIS — I10 Essential (primary) hypertension: Secondary | ICD-10-CM | POA: Diagnosis not present

## 2023-10-16 DIAGNOSIS — M545 Low back pain, unspecified: Secondary | ICD-10-CM | POA: Diagnosis not present

## 2023-10-16 LAB — URINE CULTURE: Culture: >=100000 — AB

## 2023-10-16 LAB — GLUCOSE, CAPILLARY: Glucose-Capillary: 130 mg/dL — ABNORMAL HIGH (ref 70–99)

## 2023-10-16 MED ORDER — METOPROLOL TARTRATE 25 MG PO TABS
25.0000 mg | ORAL_TABLET | Freq: Four times a day (QID) | ORAL | Status: DC
  Administered 2023-10-16 – 2023-10-17 (×5): 25 mg via ORAL
  Filled 2023-10-16 (×5): qty 1

## 2023-10-16 MED ORDER — METOPROLOL TARTRATE 5 MG/5ML IV SOLN: 5.0000 mg | Freq: Four times a day (QID) | INTRAVENOUS | Status: DC

## 2023-10-16 MED ORDER — METOPROLOL TARTRATE 5 MG/5ML IV SOLN: 5.0000 mg | Freq: Four times a day (QID) | INTRAVENOUS | Status: DC | PRN

## 2023-10-16 MED ORDER — FLECAINIDE ACETATE 50 MG PO TABS
50.0000 mg | ORAL_TABLET | Freq: Two times a day (BID) | ORAL | Status: DC
  Administered 2023-10-16 – 2023-10-18 (×5): 50 mg via ORAL
  Filled 2023-10-16 (×5): qty 1

## 2023-10-16 MED ORDER — PROMETHAZINE HCL 25 MG PO TABS
12.5000 mg | ORAL_TABLET | Freq: Four times a day (QID) | ORAL | Status: DC | PRN
  Administered 2023-10-16: 12.5 mg via ORAL
  Filled 2023-10-16 (×2): qty 1

## 2023-10-16 MED ORDER — DILTIAZEM HCL 30 MG PO TABS: 90.0000 mg | ORAL_TABLET | Freq: Four times a day (QID) | ORAL | Status: DC

## 2023-10-16 NOTE — Progress Notes (Signed)
Progress Note   Patient: Katrina Beasley UEA:[id number] DOB: [birth year] DOA: 10/14/2023     1 DOS: the patient was seen and examined on 10/16/2023   Brief hospital course: Katrina Beasley is a 82 y.o. female with medical history significant of paroxysmal atrial fibrillation on Eliquis, IBS and hypothyroidism presented to ED with lower back pain.   Found to be in A-fib with RVR which did not responded initially to IV fluid or diltiazem bolus.  UA concerning for UTI so started on Rocephin and diltiazem infusion.  Cardiology was consulted.  12/25: Heart rate improved to 90s.  Mild leukocytosis today likely reactive, TSH elevated at 9.948-increasing the Synthroid dose from 112-125 and her PCP can repeat the level and adjust doses as appropriate in 4 weeks. Echocardiogram with normal EF, indeterminate diastolic function and mildly dilated left atrium.  She was given another bolus of diltiazem, intermittent concern of atrial flutter.  Cardiology is on board.  12/26; heart rate remained elevated, cardiology added metoprolol and flecainide.  Zofran was not helping with nausea so switched with Phenergan. Remained on diltiazem infusion.  Cardiology might consider DCCV as outpatient if needed.  Urine cultures with Klebsiella pneumonia, resistant to ampicillin, Augmentin and nitrofurantoin.  Remain on ceftriaxone  Assessment and Plan: * Atrial fibrillation with RVR (HCC) Patient remained asymptomatic, heart rate remained uncontrolled despite being on maximum dose of diltiazem infusion, blood pressure borderline   Barely positive troponin with a flat curve.  BNP elevated at 635. Cardiology was consulted. -Patient was started on metoprolol and flecainide by cardiology -Continue with diltiazem infusion -Continue with Eliquis -Echocardiogram with normal EF, indeterminate diastolic function and mildly dilated left atrium.  -Continue to monitor F/U cardiology recommendations.  UTI (urinary tract  infection) Symptomatic UTI, with lower back and abdominal pain, increased urinary urgency and frequency.  No fever or dysuria. Recently received Macrobid by PCP.  UA positive for leukocytes, nitrites and bacteria.  Urine cultures with Klebsiella pneumonia which shows resistant to nitrofurantoin. -Continue with Rocephin-we can switch to Keflex once nausea improved   Essential hypertension Patient does take diltiazem at home.  Blood pressure currently elevated. -On diltiazem infusion -Continue to monitor  Back pain Patient had nonspecific lower back pain started about 2 weeks ago, patient recently completed the course of prednisone given by PCP. CT scan with punctate nonobstructive right nephrolithiasis, no hydronephrosis or ureteral calculi.  No acute fracture or dislocation. 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 No red flag symptoms. -Supportive care with pain management  Hypothyroidism TSH elevated -Increase the dose of synthroid to 125   Subjective: Patient denies any chest pain or shortness of breath.  Having mild intermittent nausea without any vomiting and Zofran was not helping.  Physical Exam: Vitals:   10/16/23 1234 10/16/23 1235 10/16/23 1236 10/16/23 1237  BP:    139/82  Pulse: 85 (!) 104 85 92  Resp: 15 13 (!) 23 15  Temp:    98.2 F (36.8 C)  TempSrc:    Oral  SpO2: 96% 96% 96% 97%  Weight:      Height:       General.  Frail elderly lady, in no acute distress. Pulmonary.  Lungs clear bilaterally, normal respiratory effort. CV.  Regular rate and rhythm, no JVD, rub or murmur. Abdomen.  Soft, nontender, nondistended, BS positive. CNS.  Alert and oriented .  No focal neurologic deficit. Extremities.  No edema, no cyanosis, pulses intact and symmetrical. Psychiatry.  Judgment and insight appears normal.  Data Reviewed: Prior data reviewed  Family Communication: Discussed with daughter at bedside  Disposition: Status is:  Inpatient Remains inpatient appropriate because: Severity of illness  Planned Discharge Destination: Home  DVT prophylaxis.  Eliquis Time spent: 50 minutes  This record has been created using Conservation officer, historic buildings. Errors have been sought and corrected,but may not always be located. Such creation errors do not reflect on the standard of care.   Author: Arnetha Courser, MD 10/16/2023 1:54 PM  For on call review [email or web address].

## 2023-10-16 NOTE — Assessment & Plan Note (Addendum)
Symptomatic UTI, with lower back and abdominal pain, increased urinary urgency and frequency.  No fever or dysuria. Recently received Macrobid by PCP.  UA positive for leukocytes, nitrites and bacteria.  Urine cultures with Klebsiella pneumonia which shows resistant to nitrofurantoin. -Switched ceftriaxone with Keflex to complete a 5-day course

## 2023-10-16 NOTE — Assessment & Plan Note (Addendum)
Patient remained asymptomatic and continued in A-fib, heart rate much controlled   Barely positive troponin with a flat curve.  BNP elevated at 635. Cardiology was consulted. -Patient was started on metoprolol and flecainide by cardiology -Flecainide dose was increased to 100 twice daily today -Cardizem infusion is being switched with p.o. Cardizem -Continue with Eliquis -Echocardiogram with normal EF, indeterminate diastolic function and mildly dilated left atrium.  -Continue to monitor F/U cardiology recommendations.

## 2023-10-16 NOTE — Progress Notes (Cosign Needed Addendum)
[facility] CLINIC CARDIOLOGY PROGRESS NOTE   Patient ID: Shulamis Stuard MRN: [id number] DOB/AGE: [birth year] 83 y.o.  Admit date: 10/14/2023 Referring Physician Dr. Arnetha Courser Primary Physician Sampson Goon Stann Mainland, MD  Primary Cardiologist Dr. Juliann Pares Reason for Consultation AF RVR  HPI: Tamie Geiger is a 82 y.o. female with a past medical history of paroxysmal atrial fibrillation on Eliquis, IBS and hypothyroidism who presented to the ED on 10/14/2023 for low back pain. Found to be in AF RVR on EKG. Cardiology was consulted for further evaluation and management.   Interval History:  -Patient seen and examined this AM. Resting comfortably in bed.  -Endorses nausea this AM. Denies any chest pain, SOB, or palpitation symptoms.  -HR elevated this AM, BP also borderline elevated.   Review of systems complete and found to be negative unless listed above    Vitals:   10/16/23 0414 10/16/23 0419 10/16/23 0500 10/16/23 0819  BP:   (!) 151/93 (!) 163/100  Pulse:   61   Resp:   11   Temp: 97.9 F (36.6 C)   98.1 F (36.7 C)  TempSrc:    Oral  SpO2:   100%   Weight:  58.9 kg    Height:         Intake/Output Summary (Last 24 hours) at 10/16/2023 8295 Last data filed at 10/16/2023 6213 Gross per 24 hour  Intake 359.02 ml  Output 650 ml  Net -290.98 ml     PHYSICAL EXAM General: Well appearing elderly female, well nourished, in no acute distress. HEENT: Normocephalic and atraumatic. Neck: No JVD.  Lungs: Normal respiratory effort on room air. Clear bilaterally to auscultation. No wheezes, crackles, rhonchi.  Heart: Irregularly irregular, elevated rate. Normal S1 and S2 without gallops or murmurs. Radial & DP pulses 2+ bilaterally. Abdomen: Non-distended appearing.  Msk: Normal strength and tone for age. Extremities: No clubbing, cyanosis or edema.   Neuro: Alert and oriented X 3. Psych: Mood appropriate, affect congruent.    LABS: Basic Metabolic Panel: Recent Labs     10/14/23 1306 10/15/23 0447  NA 132* 136  K 3.5 4.2  CL 95* 102  CO2 23 22  GLUCOSE 108* 122*  BUN 20 13  CREATININE 0.93 0.66  CALCIUM 9.8 9.0   Liver Function Tests: Recent Labs    10/14/23 1306  AST 22  ALT 26  ALKPHOS 121  BILITOT 2.1*  PROT 8.8*  ALBUMIN 5.2*   No results for input(s): "LIPASE", "AMYLASE" in the last 72 hours. CBC: Recent Labs    10/14/23 1306 10/15/23 0447  WBC 9.9 11.0*  HGB 16.5* 15.2*  HCT 49.7* 44.9  MCV 92.7 88.9  PLT 304 179   Cardiac Enzymes: Recent Labs    10/14/23 1306 10/14/23 1507  TROPONINIHS 19* 22*   BNP: Recent Labs    10/14/23 1306  BNP 635.6*   D-Dimer: No results for input(s): "DDIMER" in the last 72 hours. Hemoglobin A1C: No results for input(s): "HGBA1C" in the last 72 hours. Fasting Lipid Panel: No results for input(s): "CHOL", "HDL", "LDLCALC", "TRIG", "CHOLHDL", "LDLDIRECT" in the last 72 hours. Thyroid Function Tests: Recent Labs    10/14/23 1306  TSH 9.948*   Anemia Panel: No results for input(s): "VITAMINB12", "FOLATE", "FERRITIN", "TIBC", "IRON", "RETICCTPCT" in the last 72 hours.  ECHOCARDIOGRAM COMPLETE Result Date: 10/15/2023    ECHOCARDIOGRAM REPORT   Patient Name:   LAVEYAH PINKNEY Date of Exam: 10/15/2023 Medical Rec #:  [id number]     Height:  65.0 in Accession #:    [id number]    Weight:       123.9 lb Date of Birth:  1941-05-22     BSA:          1.614 m Patient Age:    82 years      BP:           176/107 mmHg Patient Gender: F             HR:           110 bpm. Exam Location:  [facility] Procedure: 2D Echo, Cardiac Doppler and Color Doppler Indications:     Abnormal ECG R94.31  History:         Patient has no prior history of Echocardiogram examinations.                  Arrythmias:Atrial Fibrillation.  Sonographer:     Neysa Bonito Roar Referring Phys:  [id number] EPPIRJJ AMIN Diagnosing Phys: Lorine Bears MD IMPRESSIONS  1. Left ventricular ejection fraction, by estimation, is 55 to 60%. The left  ventricle has normal function. The left ventricle has no regional wall motion abnormalities. There is moderate left ventricular hypertrophy. Left ventricular diastolic parameters are indeterminate.  2. Right ventricular systolic function is normal. The right ventricular size is normal. There is mildly elevated pulmonary artery systolic pressure. The estimated right ventricular systolic pressure is 38.5 mmHg.  3. Left atrial size was mildly dilated.  4. The mitral valve is degenerative. Mild to moderate mitral valve regurgitation. No evidence of mitral stenosis. Moderate mitral annular calcification.  5. The aortic valve is calcified. Aortic valve regurgitation is not visualized. Mild aortic valve stenosis. Aortic valve area, by VTI measures 1.28 cm. Aortic valve mean gradient measures 10.5 mmHg.  6. The inferior vena cava is normal in size with <50% respiratory variability, suggesting right atrial pressure of 8 mmHg. FINDINGS  Left Ventricle: Left ventricular ejection fraction, by estimation, is 55 to 60%. The left ventricle has normal function. The left ventricle has no regional wall motion abnormalities. The left ventricular internal cavity size was normal in size. There is  moderate left ventricular hypertrophy. Left ventricular diastolic parameters are indeterminate. Right Ventricle: The right ventricular size is normal. No increase in right ventricular wall thickness. Right ventricular systolic function is normal. There is mildly elevated pulmonary artery systolic pressure. The tricuspid regurgitant velocity is 2.76  m/s, and with an assumed right atrial pressure of 8 mmHg, the estimated right ventricular systolic pressure is 38.5 mmHg. Left Atrium: Left atrial size was mildly dilated. Right Atrium: Right atrial size was normal in size. Pericardium: There is no evidence of pericardial effusion. Mitral Valve: The mitral valve is degenerative in appearance. There is moderate thickening of the mitral valve  leaflet(s). Moderate mitral annular calcification. Mild to moderate mitral valve regurgitation. No evidence of mitral valve stenosis. MV peak gradient, 8.3 mmHg. The mean mitral valve gradient is 3.0 mmHg. Tricuspid Valve: The tricuspid valve is normal in structure. Tricuspid valve regurgitation is mild . No evidence of tricuspid stenosis. Aortic Valve: The aortic valve is calcified. Aortic valve regurgitation is not visualized. Mild aortic stenosis is present. Aortic valve mean gradient measures 10.5 mmHg. Aortic valve peak gradient measures 19.4 mmHg. Aortic valve area, by VTI measures 1.28 cm. Pulmonic Valve: The pulmonic valve was normal in structure. Pulmonic valve regurgitation is mild. No evidence of pulmonic stenosis. Aorta: The aortic root is normal in size and structure. Venous: The inferior vena cava  is normal in size with less than 50% respiratory variability, suggesting right atrial pressure of 8 mmHg. IAS/Shunts: No atrial level shunt detected by color flow Doppler.  LEFT VENTRICLE PLAX 2D LVIDd:         3.90 cm   Diastology LVIDs:         2.70 cm   LV e' medial:    10.70 cm/s LV PW:         1.20 cm   LV E/e' medial:  11.5 LV IVS:        1.50 cm   LV e' lateral:   10.80 cm/s LVOT diam:     1.70 cm   LV E/e' lateral: 11.4 LV SV:         39 LV SV Index:   24 LVOT Area:     2.27 cm  RIGHT VENTRICLE RV Basal diam:  2.80 cm RV Mid diam:    2.40 cm RV S prime:     13.80 cm/s TAPSE (M-mode): 2.1 cm LEFT ATRIUM             Index        RIGHT ATRIUM           Index LA diam:        4.00 cm 2.48 cm/m   RA Area:     14.10 cm LA Vol (A2C):   47.7 ml 29.56 ml/m  RA Volume:   30.90 ml  19.15 ml/m LA Vol (A4C):   56.5 ml 35.01 ml/m LA Biplane Vol: 54.9 ml 34.02 ml/m  AORTIC VALVE                     PULMONIC VALVE AV Area (Vmax):    1.08 cm      PV Vmax:          1.06 m/s AV Area (Vmean):   1.05 cm      PV Peak grad:     4.5 mmHg AV Area (VTI):     1.28 cm      PR End Diast Vel: 6.25 msec AV Vmax:            220.50 cm/s   RVOT Peak grad:   4 mmHg AV Vmean:          149.000 cm/s AV VTI:            0.302 m AV Peak Grad:      19.4 mmHg AV Mean Grad:      10.5 mmHg LVOT Vmax:         105.00 cm/s LVOT Vmean:        68.800 cm/s LVOT VTI:          0.171 m LVOT/AV VTI ratio: 0.57  AORTA Ao Root diam: 2.30 cm Ao Asc diam:  3.70 cm MITRAL VALVE                TRICUSPID VALVE MV Area (PHT): 3.77 cm     TR Peak grad:   30.5 mmHg MV Area VTI:   2.21 cm     TR Vmax:        276.00 cm/s MV Peak grad:  8.3 mmHg MV Mean grad:  3.0 mmHg     SHUNTS MV Vmax:       1.44 m/s     Systemic VTI:  0.17 m MV Vmean:      81.4 cm/s    Systemic Diam: 1.70 cm MV Decel Time: 201 msec MV  E velocity: 123.00 cm/s Lorine Bears MD Electronically signed by Lorine Bears MD Signature Date/Time: 10/15/2023/10:09:52 AM    Final    CT Renal Stone Study Result Date: 10/14/2023 CLINICAL DATA:  Back pain, recent UTI, stone suspected EXAM: CT ABDOMEN AND PELVIS WITHOUT CONTRAST LUMBAR SPINE WITHOUT CONTRAST TECHNIQUE: Multidetector CT imaging of the abdomen and pelvis was performed following the standard protocol without IV contrast. Multidetector CT imaging of the lumbar spine was performed following the standard protocol without IV contrast. RADIATION DOSE REDUCTION: This exam was performed according to the departmental dose-optimization program which includes automated exposure control, adjustment of the mA and/or kV according to patient size and/or use of iterative reconstruction technique. COMPARISON:  None Available. FINDINGS: CT ABDOMEN PELVIS FINDINGS Lower chest: No acute findings. Cardiomegaly. 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 Hepatobiliary: No focal liver abnormality is seen. Status post cholecystectomy. No biliary dilatation. Pancreas: Unremarkable. No pancreatic ductal dilatation or surrounding inflammatory changes. Spleen: Normal in size without significant abnormality. Adrenals/Urinary  Tract: Adrenal glands are unremarkable. Simple, benign renal cortical cysts, for which no further follow-up or characterization is required. Punctuate nonobstructive calculus of the midportion of the right kidney (series 5, image 49). No ureteral calculi or hydronephrosis. Bladder is unremarkable. Stomach/Bowel: Stomach is within normal limits. Appendix appears normal. No evidence of bowel wall thickening, distention, or inflammatory changes. Status post sigmoid colon resection and reanastomosis. Vascular/Lymphatic: Aortic atherosclerosis. No enlarged abdominal or pelvic lymph nodes. Reproductive: Status post hysterectomy. Other: No abdominal wall hernia or abnormality. No ascites. Musculoskeletal: No acute osseous findings. CT LUMBAR SPINE FINDINGS Alignment: Degenerative straightening of the normal lumbar lordosis. Dextroscoliosis of the lumbar spine, apex L3. Vertebral bodies: Intact. No fracture or dislocation. 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 Moderate to severe multilevel lumbar facet degenerative change, worst on the left. Paraspinous soft tissues: Unremarkable. IMPRESSION: 1. Punctuate nonobstructive calculus of the midportion of the right kidney. No ureteral calculi or hydronephrosis. 2. No acute noncontrast CT findings of the abdomen or pelvis. 3. No fracture or dislocation of the lumbar spine. 4. 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 Lumbar disc and neural foraminal pathology may be further evaluated by MRI if indicated by neurologically localizing signs and symptoms. 5. 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 Consider pulmonary referral and dedicated interstitial lung disease protocol CT of the chest for further evaluation on a nonemergent, outpatient basis if clinically appropriate. 6. Cardiomegaly. Aortic Atherosclerosis (ICD10-I70.0).  Electronically Signed   By: Jearld Lesch M.D.   On: 10/14/2023 14:35   CT L-SPINE NO CHARGE Result Date: 10/14/2023 CLINICAL DATA:  Back pain, recent UTI, stone suspected EXAM: CT ABDOMEN AND PELVIS WITHOUT CONTRAST LUMBAR SPINE WITHOUT CONTRAST TECHNIQUE: Multidetector CT imaging of the abdomen and pelvis was performed following the standard protocol without IV contrast. Multidetector CT imaging of the lumbar spine was performed following the standard protocol without IV contrast. RADIATION DOSE REDUCTION: This exam was performed according to the departmental dose-optimization program which includes automated exposure control, adjustment of the mA and/or kV according to patient size and/or use of iterative reconstruction technique. COMPARISON:  None Available. FINDINGS: CT ABDOMEN PELVIS FINDINGS Lower chest: No acute findings. Cardiomegaly. 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 Hepatobiliary: No focal liver abnormality is seen. Status post cholecystectomy. No biliary dilatation. Pancreas: Unremarkable. No pancreatic ductal dilatation or surrounding inflammatory changes. Spleen: Normal in size without  significant abnormality. Adrenals/Urinary Tract: Adrenal glands are unremarkable. Simple, benign renal cortical cysts, for which no further follow-up or characterization is required. Punctuate nonobstructive calculus of the midportion of the right kidney (series 5, image 49). No ureteral calculi or hydronephrosis. Bladder is unremarkable. Stomach/Bowel: Stomach is within normal limits. Appendix appears normal. No evidence of bowel wall thickening, distention, or inflammatory changes. Status post sigmoid colon resection and reanastomosis. Vascular/Lymphatic: Aortic atherosclerosis. No enlarged abdominal or pelvic lymph nodes. Reproductive: Status post hysterectomy. Other: No abdominal wall hernia or abnormality. No ascites. Musculoskeletal: No acute osseous  findings. CT LUMBAR SPINE FINDINGS Alignment: Degenerative straightening of the normal lumbar lordosis. Dextroscoliosis of the lumbar spine, apex L3. Vertebral bodies: Intact. No fracture or dislocation. 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 Moderate to severe multilevel lumbar facet degenerative change, worst on the left. Paraspinous soft tissues: Unremarkable. IMPRESSION: 1. Punctuate nonobstructive calculus of the midportion of the right kidney. No ureteral calculi or hydronephrosis. 2. No acute noncontrast CT findings of the abdomen or pelvis. 3. No fracture or dislocation of the lumbar spine. 4. 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 Lumbar disc and neural foraminal pathology may be further evaluated by MRI if indicated by neurologically localizing signs and symptoms. 5. 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 Consider pulmonary referral and dedicated interstitial lung disease protocol CT of the chest for further evaluation on a nonemergent, outpatient basis if clinically appropriate. 6. Cardiomegaly. Aortic Atherosclerosis (ICD10-I70.0). Electronically Signed   By: Jearld Lesch M.D.   On: 10/14/2023 14:35    ECHO as above  TELEMETRY reviewed by me 10/16/23: atrial fibrillation rate 110s  EKG reviewed by me 10/16/23: atrial fibrillation RVR rate 149 bpm  DATA reviewed by me 10/16/23: last 24h vitals tele labs imaging I/O, hospitalist progress note  Principal Problem:   Atrial fibrillation with RVR (HCC) Active Problems:   UTI (urinary tract infection)   Hypothyroidism   Essential hypertension   Back pain    ASSESSMENT AND PLAN: Valinda Wears is a 82 y.o. female with a past medical history of paroxysmal atrial fibrillation on Eliquis, IBS and hypothyroidism who presented to the ED on 10/14/2023 for low back pain. Found to be in AF RVR on  EKG. Cardiology was consulted for further evaluation and management.   # Atrial fibrillation RVR # Paroxysmal atrial fibrillation # Demand ischemia Patient presented with back pain found to be in atrial fibrillation RVR, has hx of PAF on eliquis. Started on diltiazem gtt in the ED. Trops 19 > 22. Rate elevated this AM.  -Start metoprolol 25 mg q6 hours. Continue diltiazem gtt. IV metoprolol prn ordered for elevated/sustaining HR.  -Start flecainide 50 mg twice daily for rhythm control. Can consider DCCV outpatient.  -Continue eliquis 5 mg twice daily for stroke risk reduction.  -Mild and flat troponin elevation likely 2/2 demand ischemia in the setting of AF RVR.  # Hypertension -Metoprolol as above.   # UTI Patient presented with back pain found to have UTI on UA. Likely contributing to AF RVR.  -Management per primary.    This patient's case was discussed and created with Dr. Darrold Junker and he is in agreement.  Signed:  Gale Journey, PA-C  10/16/2023, 8:37 AM [facility] Card[facility]

## 2023-10-17 DIAGNOSIS — N3000 Acute cystitis without hematuria: Secondary | ICD-10-CM | POA: Diagnosis not present

## 2023-10-17 DIAGNOSIS — I10 Essential (primary) hypertension: Secondary | ICD-10-CM | POA: Diagnosis not present

## 2023-10-17 DIAGNOSIS — M545 Low back pain, unspecified: Secondary | ICD-10-CM | POA: Diagnosis not present

## 2023-10-17 DIAGNOSIS — I4891 Unspecified atrial fibrillation: Secondary | ICD-10-CM | POA: Diagnosis not present

## 2023-10-17 LAB — BASIC METABOLIC PANEL
Anion gap: 12 (ref 5–15)
BUN: 16 mg/dL (ref 8–23)
CO2: 23 mmol/L (ref 22–32)
Calcium: 9 mg/dL (ref 8.9–10.3)
Chloride: 99 mmol/L (ref 98–111)
Creatinine, Ser: 0.94 mg/dL (ref 0.44–1.00)
GFR, Estimated: >60 mL/min (ref >60)
Glucose, Bld: 100 mg/dL — ABNORMAL HIGH (ref 70–99)
Potassium: 3.5 mmol/L (ref 3.5–5.1)
Sodium: 134 mmol/L — ABNORMAL LOW (ref 135–145)

## 2023-10-17 LAB — CBC
HCT: 40.2 % (ref 36.0–46.0)
Hemoglobin: 13.7 g/dL (ref 12.0–15.0)
MCH: 30.6 pg (ref 26.0–34.0)
MCHC: 34.1 g/dL (ref 30.0–36.0)
MCV: 89.7 fL (ref 80.0–100.0)
Platelets: 214 10*3/uL (ref 150–400)
RBC: 4.48 MIL/uL (ref 3.87–5.11)
RDW: 12.7 % (ref 11.5–15.5)
WBC: 8.6 10*3/uL (ref 4.0–10.5)
nRBC: 0 % (ref 0.0–0.2)

## 2023-10-17 LAB — GLUCOSE, CAPILLARY: Glucose-Capillary: 107 mg/dL — ABNORMAL HIGH (ref 70–99)

## 2023-10-17 MED ORDER — DILTIAZEM HCL 30 MG PO TABS
30.0000 mg | ORAL_TABLET | Freq: Four times a day (QID) | ORAL | Status: DC
  Administered 2023-10-17 – 2023-10-20 (×11): 30 mg via ORAL
  Filled 2023-10-17 (×11): qty 1

## 2023-10-17 MED ORDER — CEPHALEXIN 500 MG PO CAPS
500.0000 mg | ORAL_CAPSULE | Freq: Three times a day (TID) | ORAL | Status: AC
  Administered 2023-10-17 – 2023-10-18 (×5): 500 mg via ORAL
  Filled 2023-10-17 (×5): qty 1

## 2023-10-17 MED ORDER — METOPROLOL TARTRATE 50 MG PO TABS
50.0000 mg | ORAL_TABLET | Freq: Two times a day (BID) | ORAL | Status: DC
  Administered 2023-10-18 – 2023-10-20 (×5): 50 mg via ORAL
  Filled 2023-10-17 (×6): qty 1

## 2023-10-17 NOTE — Progress Notes (Cosign Needed)
[facility] CLINIC CARDIOLOGY PROGRESS NOTE   Patient ID: Katrina Beasley MRN: [id number] DOB/AGE: January 01, 1941 82 y.o.  Admit date: 10/14/2023 Referring Physician Dr. Arnetha Courser Primary Physician Sampson Goon Stann Mainland, MD  Primary Cardiologist Dr. Juliann Pares Reason for Consultation AF RVR  HPI: Katrina Beasley is a 82 y.o. female with a past medical history of paroxysmal atrial fibrillation on Eliquis, IBS and hypothyroidism who presented to the ED on 10/14/2023 for low back pain. Found to be in AF RVR on EKG. Cardiology was consulted for further evaluation and management.   Interval History:  -Patient seen and examined this Beasley. Resting comfortably in bed with family present at bedside -States she is feeling much better today. Denies any chest pain, SOB, or palpitation symptoms.  -HR much better controlled today.  Review of systems complete and found to be negative unless listed above    Vitals:   10/17/23 0004 10/17/23 0340 10/17/23 0455 10/17/23 0500  BP:  113/76    Pulse:      Resp: 20     Temp: 97.8 F (36.6 C) 97.8 F (36.6 C) (!) 97.5 F (36.4 C)   TempSrc:  Oral    SpO2:      Weight:    58.1 kg  Height:         Intake/Output Summary (Last 24 hours) at 10/17/2023 1004 Last data filed at 10/17/2023 0530 Gross per 24 hour  Intake 314.86 ml  Output 1000 ml  Net -685.14 ml     PHYSICAL EXAM General: Well appearing elderly female, well nourished, in no acute distress. HEENT: Normocephalic and atraumatic. Neck: No JVD.  Lungs: Normal respiratory effort on room air. Clear bilaterally to auscultation. No wheezes, crackles, rhonchi.  Heart: Irregularly irregular, controlled rate. Normal S1 and S2 without gallops or murmurs. Radial & DP pulses 2+ bilaterally. Abdomen: Non-distended appearing.  Msk: Normal strength and tone for age. Extremities: No clubbing, cyanosis or edema.   Neuro: Alert and oriented X 3. Psych: Mood appropriate, affect congruent.    LABS: Basic  Metabolic Panel: Recent Labs    10/15/23 0447 10/17/23 0536  NA 136 134*  K 4.2 3.5  CL 102 99  CO2 22 23  GLUCOSE 122* 100*  BUN 13 16  CREATININE 0.66 0.94  CALCIUM 9.0 9.0   Liver Function Tests: Recent Labs    10/14/23 1306  AST 22  ALT 26  ALKPHOS 121  BILITOT 2.1*  PROT 8.8*  ALBUMIN 5.2*   No results for input(s): "LIPASE", "AMYLASE" in the last 72 hours. CBC: Recent Labs    10/15/23 0447 10/17/23 0536  WBC 11.0* 8.6  HGB 15.2* 13.7  HCT 44.9 40.2  MCV 88.9 89.7  PLT 179 214   Cardiac Enzymes: Recent Labs    10/14/23 1306 10/14/23 1507  TROPONINIHS 19* 22*   BNP: Recent Labs    10/14/23 1306  BNP 635.6*   D-Dimer: No results for input(s): "DDIMER" in the last 72 hours. Hemoglobin A1C: No results for input(s): "HGBA1C" in the last 72 hours. Fasting Lipid Panel: No results for input(s): "CHOL", "HDL", "LDLCALC", "TRIG", "CHOLHDL", "LDLDIRECT" in the last 72 hours. Thyroid Function Tests: Recent Labs    10/14/23 1306  TSH 9.948*   Anemia Panel: No results for input(s): "VITAMINB12", "FOLATE", "FERRITIN", "TIBC", "IRON", "RETICCTPCT" in the last 72 hours.  No results found.   ECHO as above  TELEMETRY reviewed by me 10/17/23: atrial fibrillation rate 80s  EKG reviewed by me 10/17/23: atrial fibrillation RVR rate 149  bpm  DATA reviewed by me 10/17/23: last 24h vitals tele labs imaging I/O, hospitalist progress note  Principal Problem:   Atrial fibrillation with RVR (HCC) Active Problems:   UTI (urinary tract infection)   Hypothyroidism   Essential hypertension   Back pain    ASSESSMENT AND PLAN: [name] is a 82 y.o. female with a past medical history of paroxysmal atrial fibrillation on Eliquis, IBS and hypothyroidism who presented to the ED on 10/14/2023 for low back pain. Found to be in AF RVR on EKG. Cardiology was consulted for further evaluation and management.   # Atrial fibrillation RVR # Paroxysmal atrial  fibrillation # Demand ischemia Patient presented with back pain found to be in atrial fibrillation RVR, has hx of PAF on eliquis. Started on diltiazem gtt in the ED. Trops 19 > 22. Rate elevated this Beasley.  -Start diltiazem 30 mg q6 hours. Discontinue gtt. Consolidate metoprolol to 50 mg twice daily. -Continue flecainide 50 mg twice daily for rhythm control. Can consider DCCV outpatient.  -Continue eliquis 5 mg twice daily for stroke risk reduction.  -Mild and flat troponin elevation likely 2/2 demand ischemia in the setting of AF RVR.  # Hypertension -Metoprolol as above.   # UTI Patient presented with back pain found to have UTI on UA. Likely contributing to AF RVR.  -Management per primary.    This patient's case was discussed and created with Dr. Darrold Junker and he is in agreement.  Signed:  Gale Journey, PA-C  10/17/2023, 10:04 Beasley [facility] Cardiology

## 2023-10-17 NOTE — Progress Notes (Addendum)
Pt is BP at 89/60 MAP 70. HR 70 HR 70. Pt asymptiomatic. NP Jon Billings  made aware. Will continue to monitor.  Update 0020: NP Morison ordereed to monitor at this time. No new order place. Will continue to monitor.

## 2023-10-17 NOTE — Plan of Care (Signed)
  Problem: Education: Goal: Knowledge of General Education information will improve Description: Including pain rating scale, medication(s)/side effects and non-pharmacologic comfort measures Outcome: Progressing   Problem: Health Behavior/Discharge Planning: Goal: Ability to manage health-related needs will improve Outcome: Progressing   Problem: Clinical Measurements: Goal: Respiratory complications will improve Outcome: Progressing   Problem: Activity: Goal: Risk for activity intolerance will decrease Outcome: Progressing   Problem: Pain Management: Goal: General experience of comfort will improve Outcome: Progressing   Problem: Safety: Goal: Ability to remain free from injury will improve Outcome: Progressing

## 2023-10-17 NOTE — Progress Notes (Signed)
Progress Note   Patient: Katrina Beasley ZOX:[id number] DOB: [birth year] DOA: 10/14/2023     2 DOS: the patient was seen and examined on 10/17/2023   Brief hospital course: Katrina Beasley is a 82 y.o. female with medical history significant of paroxysmal atrial fibrillation on Eliquis, IBS and hypothyroidism presented to ED with lower back pain.   Found to be in A-fib with RVR which did not responded initially to IV fluid or diltiazem bolus.  UA concerning for UTI so started on Rocephin and diltiazem infusion.  Cardiology was consulted.  12/25: Heart rate improved to 90s.  Mild leukocytosis today likely reactive, TSH elevated at 9.948-increasing the Synthroid dose from 112-125 and her PCP can repeat the level and adjust doses as appropriate in 4 weeks. Echocardiogram with normal EF, indeterminate diastolic function and mildly dilated left atrium.  She was given another bolus of diltiazem, intermittent concern of atrial flutter.  Cardiology is on board.  12/26; heart rate remained elevated, cardiology added metoprolol and flecainide.  Zofran was not helping with nausea so switched with Phenergan. Remained on diltiazem infusion.  Cardiology might consider DCCV as outpatient if needed.  Urine cultures with Klebsiella pneumonia, resistant to ampicillin, Augmentin and nitrofurantoin.  Remain on ceftriaxone.  12/27: Remained in A-fib with improved heart rate.  Cardiology switched to p.o. Cardizem at 30 mg every 6 hourly, metoprolol 50 mg twice daily and continuing flecainide 50 mg twice daily.  Ceftriaxone switched with Keflex  Assessment and Plan: * Atrial fibrillation with RVR (HCC) Patient remained asymptomatic and continued in A-fib, heart rate much controlled   Barely positive troponin with a flat curve.  BNP elevated at 635. Cardiology was consulted. -Patient was started on metoprolol and flecainide by cardiology -Cardizem infusion is being switched with p.o. Cardizem -Continue with  Eliquis -Echocardiogram with normal EF, indeterminate diastolic function and mildly dilated left atrium.  -Continue to monitor F/U cardiology recommendations.  UTI (urinary tract infection) Symptomatic UTI, with lower back and abdominal pain, increased urinary urgency and frequency.  No fever or dysuria. Recently received Macrobid by PCP.  UA positive for leukocytes, nitrites and bacteria.  Urine cultures with Klebsiella pneumonia which shows resistant to nitrofurantoin. -Switched ceftriaxone with Keflex to complete a 5-day course   Essential hypertension Patient does take diltiazem at home.  Blood pressure currently elevated. -On diltiazem infusion -Continue to monitor  Back pain Patient had nonspecific lower back pain started about 2 weeks ago, patient recently completed the course of prednisone given by PCP. CT scan with punctate nonobstructive right nephrolithiasis, no hydronephrosis or ureteral calculi.  No acute fracture or dislocation. 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 No red flag symptoms. -Supportive care with pain management  Hypothyroidism TSH elevated -Increase the dose of synthroid to 125   Subjective: Patient was feeling much improved when seen today.  Physical Exam: Vitals:   10/17/23 0455 10/17/23 0500 10/17/23 1145 10/17/23 1658  BP:   (!) 89/73 (!) 128/106  Pulse:      Resp:   16   Temp: (!) 97.5 F (36.4 C)     TempSrc:      SpO2:      Weight:  58.1 kg    Height:       General.  Pleasant elderly lady, in no acute distress. Pulmonary.  Lungs clear bilaterally, normal respiratory effort. CV.  Irregularly irregular, no JVD, rub or murmur. Abdomen.  Soft, nontender, nondistended, BS positive. CNS.  Alert and oriented .  No focal neurologic  deficit. Extremities.  No edema, no cyanosis, pulses intact and symmetrical. Psychiatry.  Judgment and insight appears normal.    Data Reviewed: Prior data reviewed  Family  Communication: Discussed with husband at bedside  Disposition: Status is: Inpatient Remains inpatient appropriate because: Severity of illness  Planned Discharge Destination: Home  DVT prophylaxis.  Eliquis Time spent: 45 minutes  This record has been created using Conservation officer, historic buildings. Errors have been sought and corrected,but may not always be located. Such creation errors do not reflect on the standard of care.   Author: Arnetha Courser, MD 10/17/2023 5:56 PM  For on call review [email or web address].

## 2023-10-17 NOTE — TOC CM/SW Note (Signed)
Transition of Care [facility]) - Inpatient Brief Assessment   Patient Details  Name: Tanaysia Dudney MRN: [id number] Date of Birth: 09-Aug-1941  Transition of Care [facility]) CM/SW Contact:    Garret Reddish, RN Phone Number: 10/17/2023, 10:28 AM   Clinical Narrative: Chart reviewed.  Patient was admitted with Atrial fib with RVR.  Patient also being treated UTI with IV  Ceftriaxone.   I have spoken with Mrs. Sant.  Patient reports that she lives at [facility] in the Independent Living apartments.  She reports that prior to admission she was able to get around her home with a walking stick.  She reports that she only uses the walking stick for balance.  She would like to return to her apartment when medically stable.  No needs at this time.  TOC will continue to follow for discharge planning.    Transition of Care Asessment: Insurance and Status: Insurance coverage has been reviewed Patient has primary care physician: Yes Home environment has been reviewed: Lives at [facility] in Independent Living Prior level of function:: Independent ( Uses a walking stick for balance) Prior/Current Home Services: No current home services Social Drivers of Health Review: SDOH reviewed no interventions necessary Readmission risk has been reviewed: Yes Transition of care needs: no transition of care needs at this time

## 2023-10-18 DIAGNOSIS — I4891 Unspecified atrial fibrillation: Secondary | ICD-10-CM | POA: Diagnosis not present

## 2023-10-18 DIAGNOSIS — I10 Essential (primary) hypertension: Secondary | ICD-10-CM | POA: Diagnosis not present

## 2023-10-18 DIAGNOSIS — M545 Low back pain, unspecified: Secondary | ICD-10-CM | POA: Diagnosis not present

## 2023-10-18 DIAGNOSIS — N3000 Acute cystitis without hematuria: Secondary | ICD-10-CM | POA: Diagnosis not present

## 2023-10-18 LAB — GLUCOSE, CAPILLARY: Glucose-Capillary: 113 mg/dL — ABNORMAL HIGH (ref 70–99)

## 2023-10-18 MED ORDER — FLECAINIDE ACETATE 50 MG PO TABS
100.0000 mg | ORAL_TABLET | Freq: Two times a day (BID) | ORAL | Status: DC
Start: 2023-10-18 — End: 2023-10-20
  Administered 2023-10-18 – 2023-10-20 (×4): 100 mg via ORAL
  Filled 2023-10-18 (×4): qty 2

## 2023-10-18 NOTE — Evaluation (Addendum)
Physical Therapy Evaluation Patient Details Name: Katrina Beasley MRN: [id number] DOB: 03-Dec-1940 Today's Date: 10/18/2023  History of Present Illness  Donell Aleem is a 82 y.o. female with medical history significant of paroxysmal atrial fibrillation on Eliquis, IBS and hypothyroidism presented to ED with lower back pain.  Admitted for Afib with RVR and UTI. Supportive care for back pain.   Clinical Impression  Patient received alert and oriented x4 in bed eating breakfast. She was able to provide detailed history. She lives in the independent section at [facility] and is usually I with all aspects of care and mobility using a walking stick with no falls in the last 6 months. 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 Upon PT evaluation, she was mod I for bed mobility and transfers. She ambulated ~200 feet with walking stick and supervision. She demonstrated a wide stance during gait and decreased knee flexion during swing through bilaterally. She reported this is her baseline gait pattern and she has previously been unable to change it despite PT focused on correcting it. She was able to don both sneakers independently while seated at edge of bed. Her most limiting factor was increased heart rate during activity. She remained in afib and her HR rose to 131 bpm with ambulation and 154 bpm with 5 Times Sit To Stand Test.  She also reported feeling more fatigued and short of breath with mobility and gait training. Recommend she continue PT in next venue of care to improve her stamina and activity tolerance to return her to prior level of function. While hospitalized, recommend she work with mobility tech and nursing on daily ambulation and mobility to prevent further decline of function before discharge. Patient does not appear to have any further PT needs in the acute setting and will be discharged from PT caseload.       If plan is discharge home, recommend the following: Assist for transportation   Can  travel by private vehicle    yes    Equipment Recommendations None recommended by PT  Recommendations for Other Services       Functional Status Assessment Patient has had a recent decline in their functional status and demonstrates the ability to make significant improvements in function in a reasonable and predictable amount of time.     Precautions / Restrictions Precautions Precautions: Fall Restrictions Weight Bearing Restrictions Per Provider Order: No      Mobility  Bed Mobility Overal bed mobility: Independent                  Transfers Overall transfer level: Modified independent                 General transfer comment: sit <> stand from bed mod I, 5 Times Sit To Stand Test: 17 seconds from standard chair with B UE support on knees and failed last rep. HR up to 154 bpm and RN uncomfortable with this so did not re-attempt. HR came down to 120s quickly with rest.    Ambulation/Gait Ambulation/Gait assistance: Supervision Gait Distance (Feet): 200 Feet Assistive device:  (walking stick) Gait Pattern/deviations: WFL(Within Functional Limits)       General Gait Details: Patient ambulates around nursin station with wide stance and lacks knee flexion bilaterally. She reports she had PT for this gait in the past and the PT tried very hard to change her gait pattern without improvement. She states she feels more out of breath than usual  but this is her normal gait pattern.  HR up to 131 bpm and SpO2 98% by end of ambulation.  Stairs            Wheelchair Mobility     Tilt Bed    Modified Rankin (Stroke Patients Only)       Balance Overall balance assessment: Modified Independent                                           Pertinent Vitals/Pain Pain Assessment Pain Assessment: No/denies pain    Home Living Family/patient expects to be discharged to:: Private residence (at [facility] in independent living area) Living  Arrangements: Alone Available Help at Discharge: Family;Available PRN/intermittently (can stay with daughter if needed) Type of Home: Independent living facility Home Access: Other (comment) (elevator)       Home Layout: One level Home Equipment: Rollator (4 wheels);Rolling Walker (2 wheels);Shower seat;Grab bars - tub/shower;Hand held shower head (walking stick)      Prior Function Prior Level of Function : Independent/Modified Independent;Driving;Working/employed             Mobility Comments: ambulates mod I with walking stick because "I have poor balance" ADLs Comments: I with all aspects of care. 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 Denies falls in the last 6 months. She states she has been up ad-lib with RN approval in her hospital room today.     Extremity/Trunk Assessment   Upper Extremity Assessment Upper Extremity Assessment: Overall WFL for tasks assessed    Lower Extremity Assessment Lower Extremity Assessment: Overall WFL for tasks assessed    Cervical / Trunk Assessment Cervical / Trunk Assessment: Normal  Communication   Communication Communication: No apparent difficulties Cueing Techniques: Verbal cues  Cognition Arousal: Alert Behavior During Therapy: WFL for tasks assessed/performed Overall Cognitive Status: Within Functional Limits for tasks assessed                                          General Comments General comments (skin integrity, edema, etc.): 5 Times Sit To Stand Test: 17 seconds with hands on knees and failed last rep. HR up to 154 bpm but came back down to 120s quickly with rest.    Exercises Other Exercises Other Exercises: educated patient/daughter on role of PT in acute care setting. Mobility reccomendations. discharge reccomendations.   Assessment/Plan    PT Assessment All further PT needs can be  met in the next venue of care  PT Problem List Decreased mobility;Cardiopulmonary status limiting activity;Decreased activity tolerance;Decreased balance       PT Treatment Interventions      PT Goals (Current goals can be found in the Care Plan section)  Acute Rehab PT Goals Patient Stated Goal: to go home PT Goal Formulation: With patient Time For Goal Achievement: 11/01/23 Potential to Achieve Goals: Good    Frequency       Co-evaluation               AM-PAC PT "6 Clicks" Mobility  Outcome Measure Help needed turning from your back to your side while in a flat bed without using bedrails?: None Help needed moving from lying on your back to sitting on the side of a  flat bed without using bedrails?: None Help needed moving to and from a bed to a chair (including a wheelchair)?: None Help needed standing up from a chair using your arms (e.g., wheelchair or bedside chair)?: None Help needed to walk in hospital room?: None Help needed climbing 3-5 steps with a railing? : None 6 Click Score: 24    End of Session Equipment Utilized During Treatment: Gait belt Activity Tolerance: Patient tolerated treatment well Patient left: in chair;with family/visitor present;with nursing/sitter in room (RN approved no chair alarm so patien can be up ad lib) Nurse Communication: Mobility status PT Visit Diagnosis: Other abnormalities of gait and mobility (R26.89);Unsteadiness on feet (R26.81)    Time: 2841-3244 PT Time Calculation (min) (ACUTE ONLY): 30 min   Charges:   PT Evaluation $PT Eval Low Complexity: 1 Low PT Treatments $Gait Training: 8-22 mins PT General Charges $$ ACUTE PT VISIT: 1 Visit         Luretha Murphy. Ilsa Iha, PT, DPT 10/18/23, 9:44 AM

## 2023-10-18 NOTE — Plan of Care (Signed)

## 2023-10-18 NOTE — Plan of Care (Signed)
?  Problem: Education: ?Goal: Knowledge of General Education information will improve ?Description: Including pain rating scale, medication(s)/side effects and non-pharmacologic comfort measures ?Outcome: Progressing ?  ?Problem: Health Behavior/Discharge Planning: ?Goal: Ability to manage health-related needs will improve ?Outcome: Progressing ?  ?Problem: Coping: ?Goal: Level of anxiety will decrease ?Outcome: Progressing ?  ?

## 2023-10-18 NOTE — Progress Notes (Signed)
SUBJECTIVE: Patient was admitted with atrial fibrillation and UTI.  This morning patient ventricular rate was 110.  At this time heart rate is in the 70s and denies any chest pain or shortness of breath or palpitation.   Vitals:   10/18/23 0342 10/18/23 0500 10/18/23 0755 10/18/23 1111  BP: 113/86  121/75 92/63  Pulse: 93  (!) 112 78  Resp: 16  18 16   Temp: 98.3 F (36.8 C)  97.9 F (36.6 C) 98.1 F (36.7 C)  TempSrc: Oral  Oral Oral  SpO2: 99%  99% 99%  Weight:  52.2 kg    Height:        Intake/Output Summary (Last 24 hours) at 10/18/2023 1326 Last data filed at 10/17/2023 2131 Gross per 24 hour  Intake 3 ml  Output --  Net 3 ml    LABS: Basic Metabolic Panel: Recent Labs    10/17/23 0536  NA 134*  K 3.5  CL 99  CO2 23  GLUCOSE 100*  BUN 16  CREATININE 0.94  CALCIUM 9.0   Liver Function Tests: No results for input(s): "AST", "ALT", "ALKPHOS", "BILITOT", "PROT", "ALBUMIN" in the last 72 hours. No results for input(s): "LIPASE", "AMYLASE" in the last 72 hours. CBC: Recent Labs    10/17/23 0536  WBC 8.6  HGB 13.7  HCT 40.2  MCV 89.7  PLT 214   Cardiac Enzymes: No results for input(s): "CKTOTAL", "CKMB", "CKMBINDEX", "TROPONINI" in the last 72 hours. BNP: Invalid input(s): "POCBNP" D-Dimer: No results for input(s): "DDIMER" in the last 72 hours. Hemoglobin A1C: No results for input(s): "HGBA1C" in the last 72 hours. Fasting Lipid Panel: No results for input(s): "CHOL", "HDL", "LDLCALC", "TRIG", "CHOLHDL", "LDLDIRECT" in the last 72 hours. Thyroid Function Tests: No results for input(s): "TSH", "T4TOTAL", "T3FREE", "THYROIDAB" in the last 72 hours.  Invalid input(s): "FREET3" Anemia Panel: No results for input(s): "VITAMINB12", "FOLATE", "FERRITIN", "TIBC", "IRON", "RETICCTPCT" in the last 72 hours.   PHYSICAL EXAM General: Well developed, well nourished, in no acute distress HEENT:  Normocephalic and atramatic Neck:  No JVD.  Lungs: Clear  bilaterally to auscultation and percussion. Heart: HRRR . Normal S1 and S2 without gallops or murmurs.  Abdomen: Bowel sounds are positive, abdomen soft and non-tender  Msk:  Back normal, normal gait. Normal strength and tone for age. Extremities: No clubbing, cyanosis or edema.   Neuro: Alert and oriented X 3. Psych:  Good affect, responds appropriately  TELEMETRY: Atrial fibrillation with controlled ventricular rate.  ASSESSMENT AND PLAN: Atrial fibrillation with rapid ventricular response rate and UTI.  Patient is on flecainide and metoprolol and intermittently has rapid ventricular rate.  Her blood pressure has been low and has difficulty tolerating both metoprolol and Cardizem.  I reviewed her EKGs and the monitor and patient has never been in atrial flutter but has been in atrial fibrillation.  Since patient is not symptomatic from it may increase the flecainide to 100 twice daily for now.  She has not converted to sinus rhythm although she has been had paroxysmal atrial fibrillation in the past.  May consider DC cardioversion and EP evaluation and discussed that with the patient.  EP evaluation can be done outpatient and [facility] cardiology as [facility] has office hours over there.  Family is in agreement with that.  In the meantime patient was explained that atrial fibrillation when is asymptomatic can be treated with rate control or rhythm control.  I will increase the flecainide to 100 twice daily today.  ICD-10-CM   1. Atrial fibrillation with rapid ventricular response (HCC)  I48.91 Amb referral to AFIB Clinic    2. Urinary tract infection without hematuria, site unspecified  N39.0       Principal Problem:   Atrial fibrillation with RVR (HCC) Active Problems:   UTI (urinary tract infection)   Hypothyroidism   Essential hypertension   Back pain    Katrina Blackwater, MD, [facility] 10/18/2023 1:26 PM

## 2023-10-18 NOTE — Progress Notes (Signed)
Progress Note   Patient: Katrina Beasley ZOX:[id number] DOB: 05/26/1941 DOA: 10/14/2023     3 DOS: the patient was seen and examined on 10/18/2023   Brief hospital course: Lynix Glendening is a 82 y.o. female with medical history significant of paroxysmal atrial fibrillation on Eliquis, IBS and hypothyroidism presented to ED with lower back pain.   Found to be in A-fib with RVR which did not responded initially to IV fluid or diltiazem bolus.  UA concerning for UTI so started on Rocephin and diltiazem infusion.  Cardiology was consulted.  12/25: Heart rate improved to 90s.  Mild leukocytosis today likely reactive, TSH elevated at 9.948-increasing the Synthroid dose from 112-125 and her PCP can repeat the level and adjust doses as appropriate in 4 weeks. Echocardiogram with normal EF, indeterminate diastolic function and mildly dilated left atrium.  She was given another bolus of diltiazem, intermittent concern of atrial flutter.  Cardiology is on board.  12/26; heart rate remained elevated, cardiology added metoprolol and flecainide.  Zofran was not helping with nausea so switched with Phenergan. Remained on diltiazem infusion.  Cardiology might consider DCCV as outpatient if needed.  Urine cultures with Klebsiella pneumonia, resistant to ampicillin, Augmentin and nitrofurantoin.  Remain on ceftriaxone.  12/27: Remained in A-fib with improved heart rate.  Cardiology switched to p.o. Cardizem at 30 mg every 6 hourly, metoprolol 50 mg twice daily and continuing flecainide 50 mg twice daily.  Ceftriaxone switched with Keflex  12/28; heart rate improved while resting, to increase to low 100s with exertion and mobility.  Cardiology increased her flecainide to 100 mg twice daily.  Patient might need DCCV and EP evaluation which can be done as outpatient.  Assessment and Plan: * Atrial fibrillation with RVR (HCC) Patient remained asymptomatic and continued in A-fib, heart rate much controlled   Barely  positive troponin with a flat curve.  BNP elevated at 635. Cardiology was consulted. -Patient was started on metoprolol and flecainide by cardiology -Flecainide dose was increased to 100 twice daily today -Cardizem infusion is being switched with p.o. Cardizem -Continue with Eliquis -Echocardiogram with normal EF, indeterminate diastolic function and mildly dilated left atrium.  -Continue to monitor F/U cardiology recommendations.  UTI (urinary tract infection) Symptomatic UTI, with lower back and abdominal pain, increased urinary urgency and frequency.  No fever or dysuria. Recently received Macrobid by PCP.  UA positive for leukocytes, nitrites and bacteria.  Urine cultures with Klebsiella pneumonia which shows resistant to nitrofurantoin. -Switched ceftriaxone with Keflex to complete a 5-day course   Essential hypertension Patient does take diltiazem at home.  Blood pressure currently elevated. -On diltiazem infusion -Continue to monitor  Back pain Patient had nonspecific lower back pain started about 2 weeks ago, patient recently completed the course of prednisone given by PCP. CT scan with punctate nonobstructive right nephrolithiasis, no hydronephrosis or ureteral calculi.  No acute fracture or dislocation. 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 No red flag symptoms. -Supportive care with pain management  Hypothyroidism TSH elevated -Increase the dose of synthroid to 125   Subjective: Patient was seen and examined today.  No chest pain or shortness of breath.  Heart rate increases with walking.  Physical Exam: Vitals:   10/18/23 0342 10/18/23 0500 10/18/23 0755 10/18/23 1111  BP: 113/86  121/75 92/63  Pulse: 93  (!) 112 78  Resp: 16  18 16   Temp: 98.3 F (36.8 C)  97.9 F (36.6 C) 98.1 F (36.7 C)  TempSrc: Oral  Oral  Oral  SpO2: 99%  99% 99%  Weight:  52.2 kg    Height:       General.  Pleasant elderly lady, in no acute  distress. Pulmonary.  Lungs clear bilaterally, normal respiratory effort. CV.  Irregularly irregular Abdomen.  Soft, nontender, nondistended, BS positive. CNS.  Alert and oriented .  No focal neurologic deficit. Extremities.  No edema, no cyanosis, pulses intact and symmetrical. Psychiatry.  Judgment and insight appears normal.   Data Reviewed: Prior data reviewed  Family Communication: Discussed with son and DIL at bedside  Disposition: Status is: Inpatient Remains inpatient appropriate because: Severity of illness  Planned Discharge Destination: Home  DVT prophylaxis.  Eliquis Time spent: 44 minutes  This record has been created using Conservation officer, historic buildings. Errors have been sought and corrected,but may not always be located. Such creation errors do not reflect on the standard of care.   Author: Arnetha Courser, MD 10/18/2023 3:12 PM  For on call review [email or web address].

## 2023-10-19 DIAGNOSIS — I10 Essential (primary) hypertension: Secondary | ICD-10-CM | POA: Diagnosis not present

## 2023-10-19 DIAGNOSIS — N3000 Acute cystitis without hematuria: Secondary | ICD-10-CM | POA: Diagnosis not present

## 2023-10-19 DIAGNOSIS — M545 Low back pain, unspecified: Secondary | ICD-10-CM | POA: Diagnosis not present

## 2023-10-19 DIAGNOSIS — I4891 Unspecified atrial fibrillation: Secondary | ICD-10-CM | POA: Diagnosis not present

## 2023-10-19 LAB — GLUCOSE, CAPILLARY: Glucose-Capillary: 109 mg/dL — ABNORMAL HIGH (ref 70–99)

## 2023-10-19 NOTE — Progress Notes (Signed)
Progress Note   Patient: Katrina Beasley VOZ:[id number] DOB: Aug 07, 1941 DOA: 10/14/2023     4 DOS: the patient was seen and examined on 10/19/2023   Brief hospital course: Katrina Beasley is a 82 y.o. female with medical history significant of paroxysmal atrial fibrillation on Eliquis, IBS and hypothyroidism presented to ED with lower back pain.   Found to be in A-fib with RVR which did not responded initially to IV fluid or diltiazem bolus.  UA concerning for UTI so started on Rocephin and diltiazem infusion.  Cardiology was consulted.  12/25: Heart rate improved to 90s.  Mild leukocytosis today likely reactive, TSH elevated at 9.948-increasing the Synthroid dose from 112-125 and her PCP can repeat the level and adjust doses as appropriate in 4 weeks. Echocardiogram with normal EF, indeterminate diastolic function and mildly dilated left atrium.  She was given another bolus of diltiazem, intermittent concern of atrial flutter.  Cardiology is on board.  12/26; heart rate remained elevated, cardiology added metoprolol and flecainide.  Zofran was not helping with nausea so switched with Phenergan. Remained on diltiazem infusion.  Cardiology might consider DCCV as outpatient if needed.  Urine cultures with Klebsiella pneumonia, resistant to ampicillin, Augmentin and nitrofurantoin.  Remain on ceftriaxone.  12/27: Remained in A-fib with improved heart rate.  Cardiology switched to p.o. Cardizem at 30 mg every 6 hourly, metoprolol 50 mg twice daily and continuing flecainide 50 mg twice daily.  Ceftriaxone switched with Keflex  12/28; heart rate improved while resting, to increase to low 100s with exertion and mobility.  Cardiology increased her flecainide to 100 mg twice daily.  Patient might need DCCV and EP evaluation which can be done as outpatient.  12/29: Heart rate seems improving, mostly remained just below 100.  Remained asymptomatic.  Patient was quite frustrated with the care as she remained  in A-fib.  Completed the course of antibiotic.  She wants to discuss with [name] tomorrow morning before going home  Assessment and Plan: * Atrial fibrillation with RVR (HCC) Patient remained asymptomatic and continued in A-fib, heart rate much controlled   Barely positive troponin with a flat curve.  BNP elevated at 635. Cardiology was consulted. -Patient was started on metoprolol and flecainide by cardiology -Flecainide dose was increased to 100 twice daily on 12/28 -Continue with p.o. Cardizem-does need to be consolidated by cardiology -Continue with Eliquis -Echocardiogram with normal EF, indeterminate diastolic function and mildly dilated left atrium.  -Continue to monitor F/U cardiology recommendations.  UTI (urinary tract infection) Symptomatic UTI, with lower back and abdominal pain, increased urinary urgency and frequency.  No fever or dysuria. Recently received Macrobid by PCP.  UA positive for leukocytes, nitrites and bacteria.  Urine cultures with Klebsiella pneumonia which shows resistant to nitrofurantoin. -Switched ceftriaxone with Keflex to complete a 5-day course -Patient completed a course of antibiotic   Essential hypertension Patient does take diltiazem at home.  Blood pressure currently elevated. -On diltiazem infusion -Continue to monitor  Back pain Patient had nonspecific lower back pain started about 2 weeks ago, patient recently completed the course of prednisone given by PCP. CT scan with punctate nonobstructive right nephrolithiasis, no hydronephrosis or ureteral calculi.  No acute fracture or dislocation. 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 No red flag symptoms. -Supportive care with pain management  Hypothyroidism TSH elevated -Increase the dose of synthroid to 125   Subjective: Patient was sitting in chair comfortably when seen today.  She seems frustrated stating that  she feels that she is  abandoned by cardiology.  She wants to talk to [name] before discharge.  Multiple family members at bedside.  Physical Exam: Vitals:   10/19/23 0335 10/19/23 0336 10/19/23 0858 10/19/23 1243  BP: 132/89 132/89 115/71 100/76  Pulse: 80  88 78  Resp: 20   16  Temp: 98.2 F (36.8 C)  98 F (36.7 C) 97.8 F (36.6 C)  TempSrc:    Oral  SpO2: 98%  98% 99%  Weight:      Height:       General.  Well-developed elderly lady, in no acute distress. Pulmonary.  Lungs clear bilaterally, normal respiratory effort. CV.  Irregularly irregular Abdomen.  Soft, nontender, nondistended, BS positive. CNS.  Alert and oriented .  No focal neurologic deficit. Extremities.  No edema, no cyanosis, pulses intact and symmetrical. Psychiatry.  Judgment and insight appears normal.   Data Reviewed: Prior data reviewed  Family Communication: Discussed with son ,DIL and multiple other family members at bedside  Disposition: Status is: Inpatient Remains inpatient appropriate because: Severity of illness  Planned Discharge Destination: Home  DVT prophylaxis.  Eliquis Time spent: 45 minutes  This record has been created using Conservation officer, historic buildings. Errors have been sought and corrected,but may not always be located. Such creation errors do not reflect on the standard of care.   Author: Arnetha Courser, MD 10/19/2023 2:13 PM  For on call review [email or web address].

## 2023-10-19 NOTE — Plan of Care (Signed)

## 2023-10-19 NOTE — Progress Notes (Signed)
SUBJECTIVE: Patient denies any chest pain or shortness of breath.   Vitals:   10/19/23 0335 10/19/23 0336 10/19/23 0858 10/19/23 1243  BP: 132/89 132/89 115/71 100/76  Pulse: 80  88 78  Resp: 20   16  Temp: 98.2 F (36.8 C)  98 F (36.7 C) 97.8 F (36.6 C)  TempSrc:    Oral  SpO2: 98%  98% 99%  Weight:      Height:        Intake/Output Summary (Last 24 hours) at 10/19/2023 1337 Last data filed at 10/18/2023 2249 Gross per 24 hour  Intake 3 ml  Output --  Net 3 ml    LABS: Basic Metabolic Panel: Recent Labs    10/17/23 0536  NA 134*  K 3.5  CL 99  CO2 23  GLUCOSE 100*  BUN 16  CREATININE 0.94  CALCIUM 9.0   Liver Function Tests: No results for input(s): "AST", "ALT", "ALKPHOS", "BILITOT", "PROT", "ALBUMIN" in the last 72 hours. No results for input(s): "LIPASE", "AMYLASE" in the last 72 hours. CBC: Recent Labs    10/17/23 0536  WBC 8.6  HGB 13.7  HCT 40.2  MCV 89.7  PLT 214   Cardiac Enzymes: No results for input(s): "CKTOTAL", "CKMB", "CKMBINDEX", "TROPONINI" in the last 72 hours. BNP: Invalid input(s): "POCBNP" D-Dimer: No results for input(s): "DDIMER" in the last 72 hours. Hemoglobin A1C: No results for input(s): "HGBA1C" in the last 72 hours. Fasting Lipid Panel: No results for input(s): "CHOL", "HDL", "LDLCALC", "TRIG", "CHOLHDL", "LDLDIRECT" in the last 72 hours. Thyroid Function Tests: No results for input(s): "TSH", "T4TOTAL", "T3FREE", "THYROIDAB" in the last 72 hours.  Invalid input(s): "FREET3" Anemia Panel: No results for input(s): "VITAMINB12", "FOLATE", "FERRITIN", "TIBC", "IRON", "RETICCTPCT" in the last 72 hours.   PHYSICAL EXAM General: Well developed, well nourished, in no acute distress HEENT:  Normocephalic and atramatic Neck:  No JVD.  Lungs: Clear bilaterally to auscultation and percussion. Heart: HRRR . Normal S1 and S2 without gallops or murmurs.  Abdomen: Bowel sounds are positive, abdomen soft and non-tender  Msk:   Back normal, normal gait. Normal strength and tone for age. Extremities: No clubbing, cyanosis or edema.   Neuro: Alert and oriented X 3. Psych:  Good affect, responds appropriately  TELEMETRY: Atrial fibrillation with controlled ventricular rate approximately 75 bpm.  ASSESSMENT AND PLAN: Status post A-fib with rapid ventricular response rate right now has well-controlled rate between 80 and 100.  Discussed thoroughly with the patient of having DC cardioversion versus EP evaluation.  All this can be done as an outpatient as patient is asymptomatic.  Explained to the family that asymptomatic A-fib patient can be treated for rate control or rhythm control and all the options.  Continue Eliquis for stroke prevention.  Continue metoprolol and Cardizem.  Flecainide dose was increased to 100 twice daily..   ICD-10-CM   1. Atrial fibrillation with rapid ventricular response (HCC)  I48.91 Amb referral to AFIB Clinic    2. Urinary tract infection without hematuria, site unspecified  N39.0       Principal Problem:   Atrial fibrillation with RVR (HCC) Active Problems:   UTI (urinary tract infection)   Hypothyroidism   Essential hypertension   Back pain    Katrina Blackwater, MD, [facility] 10/19/2023 1:37 PM

## 2023-10-20 DIAGNOSIS — I10 Essential (primary) hypertension: Secondary | ICD-10-CM | POA: Diagnosis not present

## 2023-10-20 DIAGNOSIS — M545 Low back pain, unspecified: Secondary | ICD-10-CM | POA: Diagnosis not present

## 2023-10-20 DIAGNOSIS — I4891 Unspecified atrial fibrillation: Secondary | ICD-10-CM | POA: Diagnosis not present

## 2023-10-20 DIAGNOSIS — N390 Urinary tract infection, site not specified: Secondary | ICD-10-CM | POA: Diagnosis not present

## 2023-10-20 LAB — BASIC METABOLIC PANEL
Anion gap: 9 (ref 5–15)
BUN: 17 mg/dL (ref 8–23)
CO2: 22 mmol/L (ref 22–32)
Calcium: 8.5 mg/dL — ABNORMAL LOW (ref 8.9–10.3)
Chloride: 103 mmol/L (ref 98–111)
Creatinine, Ser: 0.71 mg/dL (ref 0.44–1.00)
GFR, Estimated: >60 mL/min (ref >60)
Glucose, Bld: 92 mg/dL (ref 70–99)
Potassium: 3.8 mmol/L (ref 3.5–5.1)
Sodium: 134 mmol/L — ABNORMAL LOW (ref 135–145)

## 2023-10-20 LAB — CBC
HCT: 36.7 % (ref 36.0–46.0)
Hemoglobin: 12.2 g/dL (ref 12.0–15.0)
MCH: 30.8 pg (ref 26.0–34.0)
MCHC: 33.2 g/dL (ref 30.0–36.0)
MCV: 92.7 fL (ref 80.0–100.0)
Platelets: 184 10*3/uL (ref 150–400)
RBC: 3.96 MIL/uL (ref 3.87–5.11)
RDW: 12.7 % (ref 11.5–15.5)
WBC: 5.7 10*3/uL (ref 4.0–10.5)
nRBC: 0 % (ref 0.0–0.2)

## 2023-10-20 LAB — GLUCOSE, CAPILLARY: Glucose-Capillary: 82 mg/dL (ref 70–99)

## 2023-10-20 MED ORDER — FLECAINIDE ACETATE 100 MG PO TABS: 100.0000 mg | ORAL_TABLET | Freq: Two times a day (BID) | ORAL | 0 refills | Status: DC

## 2023-10-20 MED ORDER — METOPROLOL TARTRATE 50 MG PO TABS: 50.0000 mg | ORAL_TABLET | Freq: Two times a day (BID) | ORAL | 0 refills | Status: DC

## 2023-10-20 MED ORDER — LEVOTHYROXINE SODIUM 125 MCG PO TABS: 125.0000 ug | ORAL_TABLET | Freq: Every day | ORAL | 2 refills | Status: DC

## 2023-10-20 MED ORDER — APIXABAN 2.5 MG PO TABS: 2.5000 mg | ORAL_TABLET | Freq: Two times a day (BID) | ORAL | 1 refills | Status: AC

## 2023-10-20 MED ORDER — DILTIAZEM HCL ER COATED BEADS 120 MG PO CP24
120.0000 mg | ORAL_CAPSULE | Freq: Every day | ORAL | Status: DC
  Administered 2023-10-20: 120 mg via ORAL
  Filled 2023-10-20: qty 1

## 2023-10-20 MED ORDER — DILTIAZEM HCL ER COATED BEADS 120 MG PO CP24: 120.0000 mg | ORAL_CAPSULE | Freq: Every day | ORAL | 1 refills | Status: DC

## 2023-10-20 NOTE — Care Management Important Message (Signed)
Important Message  Patient Details  Name: Katrina Beasley MRN: [id number] Date of Birth: 14-Jun-1941   Important Message Given:  Other (see comment)     Lenni Reckner W, CMA 10/20/2023, 3:23 PM

## 2023-10-20 NOTE — Discharge Summary (Signed)
Physician Discharge Summary   Patient: Katrina Beasley MRN: [id number] DOB: [birth year]  Admit date:     10/14/2023  Discharge date: 10/20/23  Discharge Physician: Arnetha Courser   PCP: Mick Sell, MD   Recommendations at discharge:  Please obtain CBC and BMP on follow-up Follow-up with cardiology Follow-up with primary care provider  Discharge Diagnoses: Principal Problem:   Atrial fibrillation with RVR [facility]) Active Problems:   UTI (urinary tract infection)   Essential hypertension   Back pain   Hypothyroidism   Hospital Course: Katrina Beasley is a 82 y.o. female with medical history significant of paroxysmal atrial fibrillation on Eliquis, IBS and hypothyroidism presented to ED with lower back pain.   Found to be in A-fib with RVR which did not responded initially to IV fluid or diltiazem bolus.  UA concerning for UTI so started on Rocephin and diltiazem infusion.  Cardiology was consulted.  12/25: Heart rate improved to 90s.  Mild leukocytosis today likely reactive, TSH elevated at 9.948-increasing the Synthroid dose from 112-125 and her PCP can repeat the level and adjust doses as appropriate in 4 weeks. Echocardiogram with normal EF, indeterminate diastolic function and mildly dilated left atrium.  She was given another bolus of diltiazem, intermittent concern of atrial flutter.  Cardiology is on board.  12/26; heart rate remained elevated, cardiology added metoprolol and flecainide.  Zofran was not helping with nausea so switched with Phenergan. Remained on diltiazem infusion.  Cardiology might consider DCCV as outpatient if needed.  Urine cultures with Klebsiella pneumonia, resistant to ampicillin, Augmentin and nitrofurantoin.  Remain on ceftriaxone.  12/27: Remained in A-fib with improved heart rate.  Cardiology switched to p.o. Cardizem at 30 mg every 6 hourly, metoprolol 50 mg twice daily and continuing flecainide 50 mg twice daily.  Ceftriaxone switched with  Keflex  12/28; heart rate improved while resting, to increase to low 100s with exertion and mobility.  Cardiology increased her flecainide to 100 mg twice daily.  Patient might need DCCV and EP evaluation which can be done as outpatient.  12/29: Heart rate seems improving, mostly remained just below 100.  Remained asymptomatic.  Patient was quite frustrated with the care as she remained in A-fib.  Completed the course of antibiotic.  She wants to discuss with [name] tomorrow morning before going home  12/30: Heart rate remained controlled, remained in A-fib.  Completed the course of antibiotic.  Cardiology cleared for discharge on 120 mg of Cardizem with flecainide and metoprolol and they will follow-up closely as outpatient.  If patient does not converted back to sinus rhythm with flecainide they might consider DCCV.  She is also being discharged higher doses of Synthroid due to elevated TSH and need a repeat TSH level in 3 to 4 weeks by PCP.  Patient will continue on current medications and need to have a close follow-up with her providers for further management.  Assessment and Plan: * Atrial fibrillation with RVR (HCC) Patient remained asymptomatic and continued in A-fib, heart rate much controlled   Barely positive troponin with a flat curve.  BNP elevated at 635. Cardiology was consulted. Patient is being discharged on 120 mg of diltiazem, flecainide 100 mg twice daily and metoprolol 50 mg twice daily. -Continue with Eliquis -Echocardiogram with normal EF, indeterminate diastolic function and mildly dilated left atrium.   UTI (urinary tract infection) Symptomatic UTI, with lower back and abdominal pain, increased urinary urgency and frequency.  No fever or dysuria. Recently received Macrobid  by PCP.  UA positive for leukocytes, nitrites and bacteria.  Urine cultures with Klebsiella pneumonia which shows resistant to nitrofurantoin. -Switched ceftriaxone with  Keflex to complete a 5-day course -Patient completed a course of antibiotic   Essential hypertension Blood pressure within goal. -Continue current medications  Back pain Patient had nonspecific lower back pain started about 2 weeks ago, patient recently completed the course of prednisone given by PCP. CT scan with punctate nonobstructive right nephrolithiasis, no hydronephrosis or ureteral calculi.  No acute fracture or dislocation. 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 No red flag symptoms. -Supportive care with pain management  Hypothyroidism TSH elevated -Increase the dose of synthroid to 125   Consultants: Cardiology Procedures performed: None Disposition: Home Diet recommendation:  Discharge Diet Orders (From admission, onward)     Start     Ordered   10/20/23 0000  Diet - low sodium heart healthy        10/20/23 1035           Cardiac diet DISCHARGE MEDICATION: Allergies as of 10/20/2023       Reactions   Cyclosporine Other (See Comments), Rash   Burning   Sulfa Antibiotics Itching   Azithromycin Diarrhea   Exfoliative mucosal rash.        Medication List     STOP taking these medications    diltiazem 90 MG tablet Commonly known as: CARDIZEM   nitrofurantoin (macrocrystal-monohydrate) 100 MG capsule Commonly known as: MACROBID   nystatin powder Commonly known as: MYCOSTATIN/NYSTOP   predniSONE 20 MG tablet Commonly known as: DELTASONE       TAKE these medications    apixaban 2.5 MG Tabs tablet Commonly known as: ELIQUIS Take 1 tablet (2.5 mg total) by mouth 2 (two) times daily. What changed:  medication strength how much to take   Combigan 0.2-0.5 % ophthalmic solution Generic drug: brimonidine-timolol Place 1 drop into both eyes every 12 (twelve) hours.   Desitin 40 % Pste Generic drug: Zinc Oxide Apply 1 Application topically daily.   diltiazem 120 MG 24 hr capsule Commonly known as: CARDIZEM  CD Take 1 capsule (120 mg total) by mouth daily.   flecainide 100 MG tablet Commonly known as: TAMBOCOR Take 1 tablet (100 mg total) by mouth every 12 (twelve) hours.   latanoprost 0.005 % ophthalmic solution Commonly known as: XALATAN Place 1 drop into both eyes at bedtime.   levothyroxine 125 MCG tablet Commonly known as: SYNTHROID Take 1 tablet (125 mcg total) by mouth daily before breakfast. Start taking on: October 21, 2023 What changed:  medication strength how much to take   metoprolol tartrate 50 MG tablet Commonly known as: LOPRESSOR Take 1 tablet (50 mg total) by mouth 2 (two) times daily.   ondansetron 4 MG disintegrating tablet Commonly known as: ZOFRAN-ODT Take 4 mg by mouth every 8 (eight) hours as needed.   PRESERVISION AREDS 2+MULTI VIT PO Take by mouth 2 (two) times daily.        Follow-up Information     Alwyn Pea, MD. Go in 1 week(s).   Specialties: Cardiology, Internal Medicine Why: Appointment on Monday, 10/27/23 at 11:15am. Contact information: [address] [place] [address] [phone]         Mick Sell, MD. Schedule an appointment as soon as possible for a visit in 1 week(s).   Specialty: Infectious Diseases Why: Please call and schedule appointment Contact information: [address] [place] [address] [phone]  Discharge Exam: Filed Weights   10/17/23 0500 10/18/23 0500 10/20/23 0431  Weight: 58.1 kg 52.2 kg 55.9 kg   General.  Pleasant elderly lady, in no acute distress. Pulmonary.  Lungs clear bilaterally, normal respiratory effort. CV.  Irregularly irregular Abdomen.  Soft, nontender, nondistended, BS positive. CNS.  Alert and oriented .  No focal neurologic deficit. Extremities.  No edema, no cyanosis, pulses intact and symmetrical. Psychiatry.  Judgment and insight appears normal.   Condition at discharge: stable  The results of significant  diagnostics from this hospitalization (including imaging, microbiology, ancillary and laboratory) are listed below for reference.   Imaging Studies: ECHOCARDIOGRAM COMPLETE Result Date: 10/15/2023    ECHOCARDIOGRAM REPORT   Patient Name:   KEHAULANI ODEGAARD Date of Exam: 10/15/2023 Medical Rec #:  [id number]     Height:       65.0 in Accession #:    [id number]    Weight:       123.9 lb Date of Birth:  [birth year]     BSA:          1.614 m Patient Age:    82 years      BP:           176/107 mmHg Patient Gender: F             HR:           110 bpm. Exam Location:  [facility] Procedure: 2D Echo, Cardiac Doppler and Color Doppler Indications:     Abnormal ECG R94.31  History:         Patient has no prior history of Echocardiogram examinations.                  Arrythmias:Atrial Fibrillation.  Sonographer:     Neysa Bonito Roar Referring Phys:  [id number] FUXNATF Traylen Eckels Diagnosing Phys: Lorine Bears MD IMPRESSIONS  1. Left ventricular ejection fraction, by estimation, is 55 to 60%. The left ventricle has normal function. The left ventricle has no regional wall motion abnormalities. There is moderate left ventricular hypertrophy. Left ventricular diastolic parameters are indeterminate.  2. Right ventricular systolic function is normal. The right ventricular size is normal. There is mildly elevated pulmonary artery systolic pressure. The estimated right ventricular systolic pressure is 38.5 mmHg.  3. Left atrial size was mildly dilated.  4. The mitral valve is degenerative. Mild to moderate mitral valve regurgitation. No evidence of mitral stenosis. Moderate mitral annular calcification.  5. The aortic valve is calcified. Aortic valve regurgitation is not visualized. Mild aortic valve stenosis. Aortic valve area, by VTI measures 1.28 cm. Aortic valve mean gradient measures 10.5 mmHg.  6. The inferior vena cava is normal in size with <50% respiratory variability, suggesting right atrial pressure of 8 mmHg. FINDINGS  Left Ventricle: Left  ventricular ejection fraction, by estimation, is 55 to 60%. The left ventricle has normal function. The left ventricle has no regional wall motion abnormalities. The left ventricular internal cavity size was normal in size. There is  moderate left ventricular hypertrophy. Left ventricular diastolic parameters are indeterminate. Right Ventricle: The right ventricular size is normal. No increase in right ventricular wall thickness. Right ventricular systolic function is normal. There is mildly elevated pulmonary artery systolic pressure. The tricuspid regurgitant velocity is 2.76  m/s, and with an assumed right atrial pressure of 8 mmHg, the estimated right ventricular systolic pressure is 38.5 mmHg. Left Atrium: Left atrial size was mildly dilated. Right Atrium: Right atrial size was normal in size. Pericardium: There is  no evidence of pericardial effusion. Mitral Valve: The mitral valve is degenerative in appearance. There is moderate thickening of the mitral valve leaflet(s). Moderate mitral annular calcification. Mild to moderate mitral valve regurgitation. No evidence of mitral valve stenosis. MV peak gradient, 8.3 mmHg. The mean mitral valve gradient is 3.0 mmHg. Tricuspid Valve: The tricuspid valve is normal in structure. Tricuspid valve regurgitation is mild . No evidence of tricuspid stenosis. Aortic Valve: The aortic valve is calcified. Aortic valve regurgitation is not visualized. Mild aortic stenosis is present. Aortic valve mean gradient measures 10.5 mmHg. Aortic valve peak gradient measures 19.4 mmHg. Aortic valve area, by VTI measures 1.28 cm. Pulmonic Valve: The pulmonic valve was normal in structure. Pulmonic valve regurgitation is mild. No evidence of pulmonic stenosis. Aorta: The aortic root is normal in size and structure. Venous: The inferior vena cava is normal in size with less than 50% respiratory variability, suggesting right atrial pressure of 8 mmHg. IAS/Shunts: No atrial level shunt  detected by color flow Doppler.  LEFT VENTRICLE PLAX 2D LVIDd:         3.90 cm   Diastology LVIDs:         2.70 cm   LV e' medial:    10.70 cm/s LV PW:         1.20 cm   LV E/e' medial:  11.5 LV IVS:        1.50 cm   LV e' lateral:   10.80 cm/s LVOT diam:     1.70 cm   LV E/e' lateral: 11.4 LV SV:         39 LV SV Index:   24 LVOT Area:     2.27 cm  RIGHT VENTRICLE RV Basal diam:  2.80 cm RV Mid diam:    2.40 cm RV S prime:     13.80 cm/s TAPSE (M-mode): 2.1 cm LEFT ATRIUM             Index        RIGHT ATRIUM           Index LA diam:        4.00 cm 2.48 cm/m   RA Area:     14.10 cm LA Vol (A2C):   47.7 ml 29.56 ml/m  RA Volume:   30.90 ml  19.15 ml/m LA Vol (A4C):   56.5 ml 35.01 ml/m LA Biplane Vol: 54.9 ml 34.02 ml/m  AORTIC VALVE                     PULMONIC VALVE AV Area (Vmax):    1.08 cm      PV Vmax:          1.06 m/s AV Area (Vmean):   1.05 cm      PV Peak grad:     4.5 mmHg AV Area (VTI):     1.28 cm      PR End Diast Vel: 6.25 msec AV Vmax:           220.50 cm/s   RVOT Peak grad:   4 mmHg AV Vmean:          149.000 cm/s AV VTI:            0.302 m AV Peak Grad:      19.4 mmHg AV Mean Grad:      10.5 mmHg LVOT Vmax:         105.00 cm/s LVOT Vmean:        68.800  cm/s LVOT VTI:          0.171 m LVOT/AV VTI ratio: 0.57  AORTA Ao Root diam: 2.30 cm Ao Asc diam:  3.70 cm MITRAL VALVE                TRICUSPID VALVE MV Area (PHT): 3.77 cm     TR Peak grad:   30.5 mmHg MV Area VTI:   2.21 cm     TR Vmax:        276.00 cm/s MV Peak grad:  8.3 mmHg MV Mean grad:  3.0 mmHg     SHUNTS MV Vmax:       1.44 m/s     Systemic VTI:  0.17 m MV Vmean:      81.4 cm/s    Systemic Diam: 1.70 cm MV Decel Time: 201 msec MV E velocity: 123.00 cm/s Lorine Bears MD Electronically signed by Lorine Bears MD Signature Date/Time: 10/15/2023/10:09:52 AM    Final    CT Renal Stone Study Result Date: 10/14/2023 CLINICAL DATA:  Back pain, recent UTI, stone suspected EXAM: CT ABDOMEN AND PELVIS WITHOUT CONTRAST LUMBAR SPINE  WITHOUT CONTRAST TECHNIQUE: Multidetector CT imaging of the abdomen and pelvis was performed following the standard protocol without IV contrast. Multidetector CT imaging of the lumbar spine was performed following the standard protocol without IV contrast. RADIATION DOSE REDUCTION: This exam was performed according to the departmental dose-optimization program which includes automated exposure control, adjustment of the mA and/or kV according to patient size and/or use of iterative reconstruction technique. COMPARISON:  None Available. FINDINGS: CT ABDOMEN PELVIS FINDINGS Lower chest: No acute findings. Cardiomegaly. 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 Hepatobiliary: No focal liver abnormality is seen. Status post cholecystectomy. No biliary dilatation. Pancreas: Unremarkable. No pancreatic ductal dilatation or surrounding inflammatory changes. Spleen: Normal in size without significant abnormality. Adrenals/Urinary Tract: Adrenal glands are unremarkable. Simple, benign renal cortical cysts, for which no further follow-up or characterization is required. Punctuate nonobstructive calculus of the midportion of the right kidney (series 5, image 49). No ureteral calculi or hydronephrosis. Bladder is unremarkable. Stomach/Bowel: Stomach is within normal limits. Appendix appears normal. No evidence of bowel wall thickening, distention, or inflammatory changes. Status post sigmoid colon resection and reanastomosis. Vascular/Lymphatic: Aortic atherosclerosis. No enlarged abdominal or pelvic lymph nodes. Reproductive: Status post hysterectomy. Other: No abdominal wall hernia or abnormality. No ascites. Musculoskeletal: No acute osseous findings. CT LUMBAR SPINE FINDINGS Alignment: Degenerative straightening of the normal lumbar lordosis. Dextroscoliosis of the lumbar spine, apex L3. Vertebral bodies: Intact. No fracture or dislocation. Disc spaces: Moderate to severe  multilevel lumbar disc degenerative disease, worst at the left aspect of L3-L5. Moderate to severe multilevel lumbar facet degenerative change, worst on the left. Paraspinous soft tissues: Unremarkable. IMPRESSION: 1. Punctuate nonobstructive calculus of the midportion of the right kidney. No ureteral calculi or hydronephrosis. 2. No acute noncontrast CT findings of the abdomen or pelvis. 3. No fracture or dislocation of the lumbar spine. 4. 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 Lumbar disc and neural foraminal pathology may be further evaluated by MRI if indicated by neurologically localizing signs and symptoms. 5. 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 Consider pulmonary referral and dedicated interstitial lung disease protocol CT of the chest for further evaluation on a nonemergent, outpatient basis if clinically appropriate. 6. Cardiomegaly. Aortic Atherosclerosis (ICD10-I70.0). Electronically Signed   By: Jearld Lesch M.D.   On: 10/14/2023 14:35   CT L-SPINE  NO CHARGE Result Date: 10/14/2023 CLINICAL DATA:  Back pain, recent UTI, stone suspected EXAM: CT ABDOMEN AND PELVIS WITHOUT CONTRAST LUMBAR SPINE WITHOUT CONTRAST TECHNIQUE: Multidetector CT imaging of the abdomen and pelvis was performed following the standard protocol without IV contrast. Multidetector CT imaging of the lumbar spine was performed following the standard protocol without IV contrast. RADIATION DOSE REDUCTION: This exam was performed according to the departmental dose-optimization program which includes automated exposure control, adjustment of the mA and/or kV according to patient size and/or use of iterative reconstruction technique. COMPARISON:  None Available. FINDINGS: CT ABDOMEN PELVIS FINDINGS Lower chest: No acute findings. Cardiomegaly. 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 Hepatobiliary: No  focal liver abnormality is seen. Status post cholecystectomy. No biliary dilatation. Pancreas: Unremarkable. No pancreatic ductal dilatation or surrounding inflammatory changes. Spleen: Normal in size without significant abnormality. Adrenals/Urinary Tract: Adrenal glands are unremarkable. Simple, benign renal cortical cysts, for which no further follow-up or characterization is required. Punctuate nonobstructive calculus of the midportion of the right kidney (series 5, image 49). No ureteral calculi or hydronephrosis. Bladder is unremarkable. Stomach/Bowel: Stomach is within normal limits. Appendix appears normal. No evidence of bowel wall thickening, distention, or inflammatory changes. Status post sigmoid colon resection and reanastomosis. Vascular/Lymphatic: Aortic atherosclerosis. No enlarged abdominal or pelvic lymph nodes. Reproductive: Status post hysterectomy. Other: No abdominal wall hernia or abnormality. No ascites. Musculoskeletal: No acute osseous findings. CT LUMBAR SPINE FINDINGS Alignment: Degenerative straightening of the normal lumbar lordosis. Dextroscoliosis of the lumbar spine, apex L3. Vertebral bodies: Intact. No fracture or dislocation. 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 Moderate to severe multilevel lumbar facet degenerative change, worst on the left. Paraspinous soft tissues: Unremarkable. IMPRESSION: 1. Punctuate nonobstructive calculus of the midportion of the right kidney. No ureteral calculi or hydronephrosis. 2. No acute noncontrast CT findings of the abdomen or pelvis. 3. No fracture or dislocation of the lumbar spine. 4. 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 Lumbar disc and neural foraminal pathology may be further evaluated by MRI if indicated by neurologically localizing signs and symptoms. 5. Bibasilar pulmonary fibrosis featuring subpleural  bronchiolectasis. Consider pulmonary referral and dedicated interstitial lung disease protocol CT of the chest for further evaluation on a nonemergent, outpatient basis if clinically appropriate. 6. Cardiomegaly. Aortic Atherosclerosis (ICD10-I70.0). Electronically Signed   By: Jearld Lesch M.D.   On: 10/14/2023 14:35    Microbiology: Results for orders placed or performed during the hospital encounter of 10/14/23  Urine Culture     Status: Abnormal   Collection Time: 10/14/23  1:06 PM   Specimen: Urine, Clean Catch  Result Value Ref Range Status   Specimen Description   Final    URINE, CLEAN CATCH Performed at [facility], [address] [place], [place] [address]    Special Requests   Final    NONE Performed at [facility] [place] [facility], [address] [place], [place] [address]    Culture >=100,000 COLONIES/mL KLEBSIELLA PNEUMONIAE (A)  Final   Report Status 10/16/2023 FINAL  Final   Organism ID, Bacteria KLEBSIELLA PNEUMONIAE (A)  Final      Susceptibility   Klebsiella pneumoniae - MIC*    AMPICILLIN >=32 RESISTANT Resistant     CEFAZOLIN <=4 SENSITIVE Sensitive     CEFEPIME <=0.12 SENSITIVE Sensitive     CEFTRIAXONE <=0.25 SENSITIVE Sensitive     CIPROFLOXACIN <=0.25 SENSITIVE Sensitive  GENTAMICIN <=1 SENSITIVE Sensitive     IMIPENEM <=0.25 SENSITIVE Sensitive     NITROFURANTOIN 256 RESISTANT Resistant     TRIMETH/SULFA <=20 SENSITIVE Sensitive     AMPICILLIN/SULBACTAM >=32 RESISTANT Resistant     PIP/TAZO 64 INTERMEDIATE Intermediate ug/mL    * >=100,000 COLONIES/mL KLEBSIELLA PNEUMONIAE  MRSA Next Gen by PCR, Nasal     Status: None   Collection Time: 10/15/23  8:50 PM   Specimen: Nasal Mucosa; Nasal Swab  Result Value Ref Range Status   MRSA by PCR Next Gen NOT DETECTED NOT DETECTED Final    Comment: (NOTE) The GeneXpert MRSA Assay (FDA approved for NASAL specimens only), is one component of a comprehensive MRSA colonization surveillance program. It is  not intended to diagnose MRSA infection nor to guide or monitor treatment for MRSA infections. Test performance is not FDA approved in patients less than 62 years old. Performed at [facility], [address] [place], [place] [address]     Labs: CBC: Recent Labs  Lab 10/14/23 1306 10/15/23 0447 10/17/23 0536 10/20/23 0247  WBC 9.9 11.0* 8.6 5.7  HGB 16.5* 15.2* 13.7 12.2  HCT 49.7* 44.9 40.2 36.7  MCV 92.7 88.9 89.7 92.7  PLT 304 179 214 184   Basic Metabolic Panel: Recent Labs  Lab 10/14/23 1306 10/15/23 0447 10/17/23 0536 10/20/23 0247  NA 132* 136 134* 134*  K 3.5 4.2 3.5 3.8  CL 95* 102 99 103  CO2 23 22 23 22   GLUCOSE 108* 122* 100* 92  BUN 20 13 16 17   CREATININE 0.93 0.66 0.94 0.71  CALCIUM 9.8 9.0 9.0 8.5*   Liver Function Tests: Recent Labs  Lab 10/14/23 1306  AST 22  ALT 26  ALKPHOS 121  BILITOT 2.1*  PROT 8.8*  ALBUMIN 5.2*   CBG: Recent Labs  Lab 10/16/23 0817 10/17/23 0803 10/18/23 0759 10/19/23 0918 10/20/23 0748  GLUCAP 130* 107* 113* 109* 82    Discharge time spent: > 30 minutes.  This record has been created using Conservation officer, historic buildings. Errors have been sought and corrected,but may not always be located. Such creation errors do not reflect on the standard of care.   Signed: Arnetha Courser, MD [facility] 10/20/2023

## 2023-10-20 NOTE — Progress Notes (Signed)
12/30 patient d/c'd prior to IM given.  IM to be mailed to address on file

## 2023-10-20 NOTE — Plan of Care (Signed)
  Problem: Education: Goal: Knowledge of General Education information will improve Description: Including pain rating scale, medication(s)/side effects and non-pharmacologic comfort measures 10/20/2023 0431 by Kristeen Mans, RN Outcome: Progressing 10/20/2023 0431 by Kristeen Mans, RN Outcome: Progressing   Problem: Health Behavior/Discharge Planning: Goal: Ability to manage health-related needs will improve 10/20/2023 0431 by Kristeen Mans, RN Outcome: Progressing 10/20/2023 0431 by Kristeen Mans, RN Outcome: Progressing   Problem: Clinical Measurements: Goal: Ability to maintain clinical measurements within normal limits will improve 10/20/2023 0431 by Kristeen Mans, RN Outcome: Progressing 10/20/2023 0431 by Kristeen Mans, RN Outcome: Progressing Goal: Will remain free from infection 10/20/2023 0431 by Kristeen Mans, RN Outcome: Progressing 10/20/2023 0431 by Kristeen Mans, RN Outcome: Progressing Goal: Diagnostic test results will improve 10/20/2023 0431 by Kristeen Mans, RN Outcome: Progressing 10/20/2023 0431 by Kristeen Mans, RN Outcome: Progressing Goal: Respiratory complications will improve 10/20/2023 0431 by Kristeen Mans, RN Outcome: Progressing 10/20/2023 0431 by Kristeen Mans, RN Outcome: Progressing Goal: Cardiovascular complication will be avoided 10/20/2023 0431 by Kristeen Mans, RN Outcome: Progressing 10/20/2023 0431 by Kristeen Mans, RN Outcome: Progressing   Problem: Activity: Goal: Risk for activity intolerance will decrease 10/20/2023 0431 by Kristeen Mans, RN Outcome: Progressing 10/20/2023 0431 by Kristeen Mans, RN Outcome: Progressing   Problem: Nutrition: Goal: Adequate nutrition will be maintained 10/20/2023 0431 by Kristeen Mans, RN Outcome: Progressing 10/20/2023 0431 by Kristeen Mans, RN Outcome:  Progressing   Problem: Coping: Goal: Level of anxiety will decrease Outcome: Progressing   Problem: Elimination: Goal: Will not experience complications related to bowel motility Outcome: Progressing Goal: Will not experience complications related to urinary retention Outcome: Progressing   Problem: Pain Management: Goal: General experience of comfort will improve Outcome: Progressing   Problem: Safety: Goal: Ability to remain free from injury will improve Outcome: Progressing   Problem: Skin Integrity: Goal: Risk for impaired skin integrity will decrease Outcome: Progressing   Problem: Education: Goal: Knowledge of disease or condition will improve Outcome: Progressing Goal: Understanding of medication regimen will improve Outcome: Progressing Goal: Individualized Educational Video(s) Outcome: Progressing   Problem: Activity: Goal: Ability to tolerate increased activity will improve Outcome: Progressing   Problem: Cardiac: Goal: Ability to achieve and maintain adequate cardiopulmonary perfusion will improve Outcome: Progressing   Problem: Health Behavior/Discharge Planning: Goal: Ability to safely manage health-related needs after discharge will improve Outcome: Progressing

## 2023-10-20 NOTE — Progress Notes (Signed)
[facility] CARDIOLOGY PROGRESS NOTE   Patient ID: Katrina Beasley MRN: [id number] DOB/AGE: July 03, 1941 82 y.o.  Admit date: 10/14/2023 Referring Physician Dr. Arnetha Courser Primary Physician Sampson Goon Stann Mainland, MD  Primary Cardiologist Dr. Juliann Pares Reason for Consultation AF RVR  HPI: Katrina Beasley is a 82 y.o. female with a past medical history of paroxysmal atrial fibrillation on Eliquis, IBS and hypothyroidism who presented to the ED on 10/14/2023 for low back pain. Found to be in AF RVR on EKG. Cardiology was consulted for further evaluation and management.   Interval History:  -Patient seen and examined this AM. Resting comfortably in bed with daughter present at bedside. -Feeling well overall. Denies any chest pain, SOB, palpitation symptoms.  -HR remains controlled, still in AF.  Review of systems complete and found to be negative unless listed above    Vitals:   10/20/23 0051 10/20/23 0429 10/20/23 0431 10/20/23 0746  BP: 113/78 (!) 140/96  (!) 133/90  Pulse: 96 91  84  Resp: 18 20  18   Temp: 98 F (36.7 C) 98.5 F (36.9 C)  98 F (36.7 C)  TempSrc: Oral Oral    SpO2: 98% 99%  97%  Weight:   55.9 kg   Height:        No intake or output data in the 24 hours ending 10/20/23 0926    PHYSICAL EXAM General: Well appearing elderly female, well nourished, in no acute distress sitting upright in bedside chair. HEENT: Normocephalic and atraumatic. Neck: No JVD.  Lungs: Normal respiratory effort on room air. Clear bilaterally to auscultation. No wheezes, crackles, rhonchi.  Heart: Irregularly irregular, controlled rate. Normal S1 and S2 without gallops or murmurs. Radial & DP pulses 2+ bilaterally. Abdomen: Non-distended appearing.  Msk: Normal strength and tone for age. Extremities: No clubbing, cyanosis or edema.   Neuro: Alert and oriented X 3. Psych: Mood appropriate, affect congruent.    LABS: Basic Metabolic Panel: Recent Labs    10/20/23 0247  NA 134*   K 3.8  CL 103  CO2 22  GLUCOSE 92  BUN 17  CREATININE 0.71  CALCIUM 8.5*   Liver Function Tests: No results for input(s): "AST", "ALT", "ALKPHOS", "BILITOT", "PROT", "ALBUMIN" in the last 72 hours.  No results for input(s): "LIPASE", "AMYLASE" in the last 72 hours. CBC: Recent Labs    10/20/23 0247  WBC 5.7  HGB 12.2  HCT 36.7  MCV 92.7  PLT 184   Cardiac Enzymes: No results for input(s): "CKTOTAL", "CKMB", "CKMBINDEX", "TROPONINIHS" in the last 72 hours.  BNP: No results for input(s): "BNP" in the last 72 hours.  D-Dimer: No results for input(s): "DDIMER" in the last 72 hours. Hemoglobin A1C: No results for input(s): "HGBA1C" in the last 72 hours. Fasting Lipid Panel: No results for input(s): "CHOL", "HDL", "LDLCALC", "TRIG", "CHOLHDL", "LDLDIRECT" in the last 72 hours. Thyroid Function Tests: No results for input(s): "TSH", "T4TOTAL", "T3FREE", "THYROIDAB" in the last 72 hours.  Invalid input(s): "FREET3"  Anemia Panel: No results for input(s): "VITAMINB12", "FOLATE", "FERRITIN", "TIBC", "IRON", "RETICCTPCT" in the last 72 hours.  No results found.   ECHO as above  TELEMETRY reviewed by me 10/20/23: atrial fibrillation rate 80s  EKG reviewed by me 10/20/23: atrial fibrillation RVR rate 149 bpm  DATA reviewed by me 10/20/23: last 24h vitals tele labs imaging I/O, hospitalist progress note  Principal Problem:   Atrial fibrillation with RVR (HCC) Active Problems:   UTI (urinary tract infection)   Hypothyroidism   Essential hypertension  Back pain    ASSESSMENT AND PLAN: Katrina Beasley is a 82 y.o. female with a past medical history of paroxysmal atrial fibrillation on Eliquis, IBS and hypothyroidism who presented to the ED on 10/14/2023 for low back pain. Found to be in AF RVR on EKG. Cardiology was consulted for further evaluation and management.   # Atrial fibrillation RVR # Paroxysmal atrial fibrillation # Demand ischemia Patient presented with  back pain found to be in atrial fibrillation RVR, has hx of PAF on eliquis. Started on diltiazem gtt in the ED. Trops 19 > 22. Rate elevated this AM.  -Consolidate diltiazem to 120 mg daily. Continue metoprolol 50 mg twice daily. -Continue flecainide 100 mg twice daily for rhythm control. Can consider DCCV outpatient.  -Continue eliquis 5 mg twice daily for stroke risk reduction.  -Mild and flat troponin elevation likely 2/2 demand ischemia in the setting of AF RVR.  # Hypertension -Metoprolol as above.   # UTI Patient presented with back pain found to have UTI on UA. Likely contributing to AF RVR.  -Management per primary.   Ok for discharge today from a cardiac perspective. Will arrange for follow up in clinic with Dr. Juliann Pares in 1-2 weeks.    This patient's case was discussed and created with Dr. Juliann Pares and he is in agreement.  Signed:  Gale Journey, PA-C  10/20/2023, 9:26 AM [facility] Cardiology

## 2023-12-18 ENCOUNTER — Emergency Department
Admission: EM | Admit: 2023-12-18 | Discharge: 2023-12-18 | Disposition: A | Discharge status: Home / Self Care | Payer: Medicare Other | Attending: Emergency Medicine | Admitting: Emergency Medicine

## 2023-12-18 ENCOUNTER — Emergency Department: Payer: Medicare Other

## 2023-12-18 ENCOUNTER — Other Ambulatory Visit: Payer: Self-pay

## 2023-12-18 DIAGNOSIS — Z7901 Long term (current) use of anticoagulants: Secondary | ICD-10-CM | POA: Diagnosis not present

## 2023-12-18 DIAGNOSIS — S0990XA Unspecified injury of head, initial encounter: Secondary | ICD-10-CM | POA: Diagnosis present

## 2023-12-18 DIAGNOSIS — W01198A Fall on same level from slipping, tripping and stumbling with subsequent striking against other object, initial encounter: Secondary | ICD-10-CM | POA: Insufficient documentation

## 2023-12-18 DIAGNOSIS — S0003XA Contusion of scalp, initial encounter: Secondary | ICD-10-CM | POA: Insufficient documentation

## 2023-12-18 DIAGNOSIS — E039 Hypothyroidism, unspecified: Secondary | ICD-10-CM | POA: Diagnosis not present

## 2023-12-18 NOTE — ED Provider Notes (Signed)
 [facility] Provider Note    Event Date/Time   First MD Initiated Contact with Patient 12/18/23 1443     (approximate)   History   Fall   HPI  Katrina Beasley is a 83 y.o. female with PMH of IBS, hypothyroidism and A-fib on Eliquis presents for evaluation of head injury after a fall.  Patient was out to lunch today when she tripped on the curb causing her to fall and hit her head on the sidewalk.  She reports a localized pain to the area but denies headache, blurry vision, nausea and vomiting.  She states she feels fine but knew she needed to be evaluated given that she is on a blood thinner.      Physical Exam   Triage Vital Signs: ED Triage Vitals  Encounter Vitals Group     BP 12/18/23 1431 (!) 134/102     Systolic BP Percentile --      Diastolic BP Percentile --      Pulse Rate 12/18/23 1431 79     Resp 12/18/23 1431 17     Temp 12/18/23 1431 98 F (36.7 C)     Temp Source 12/18/23 1431 Oral     SpO2 12/18/23 1431 99 %     Weight 12/18/23 1432 120 lb (54.4 kg)     Height 12/18/23 1432 5\' 5"  (1.651 m)     Head Circumference --      Peak Flow --      Pain Score 12/18/23 1432 0     Pain Loc --      Pain Education --      Exclude from Growth Chart --     Most recent vital signs: Vitals:   12/18/23 1431  BP: (!) 134/102  Pulse: 79  Resp: 17  Temp: 98 F (36.7 C)  SpO2: 99%   General: Awake, no distress.  CV:  Good peripheral perfusion. Irregular rhythm and rate. Resp:  Normal effort. CTAB. Abd:  No distention.  Other:  No focal neuro deficits, PERRL, EOM intact.   ED Results / Procedures / Treatments   Labs (all labs ordered are listed, but only abnormal results are displayed) Labs Reviewed - No data to display  RADIOLOGY  CT head obtained, I interpreted the images as well as reviewed the radiologist report which was negative for any acute abnormalities, but does show hematoma on the left forehead along with chronic ischemic  small vessel disease.   PROCEDURES:  Critical Care performed: No  Procedures   MEDICATIONS ORDERED IN ED: Medications - No data to display   IMPRESSION / MDM / ASSESSMENT AND PLAN / ED COURSE  I reviewed the triage vital signs and the nursing notes.                             83 year old female presents for evaluation of a head injury after a fall. Patient's BP was elevated on presentation, VSS otherwise. Patient NAD.  Differential diagnosis includes, but is not limited to, intracranial bleed, contusion, skull fracture, laceration, neck injury.   Patient's presentation is most consistent with acute complicated illness / injury requiring diagnostic workup.  CT head negative for any acute intracranial abnormalities but does show hematoma on the left forehead.  Physical exam is reassuring, no focal neurodeficits identified and patient has minimal symptoms at this time.  Given negative imaging I feel she stable for outpatient management.  Patient was given return precautions.  She voiced understanding, all questions were answered and she is stable at discharge.      FINAL CLINICAL IMPRESSION(S) / ED DIAGNOSES   Final diagnoses:  Contusion of scalp, initial encounter     Rx / DC Orders   ED Discharge Orders     None        Note:  This document was prepared using Dragon voice recognition software and may include unintentional dictation errors.   Cameron Ali, PA-C 12/18/23 1648    Dionne Bucy, MD 12/18/23 [phone]

## 2023-12-18 NOTE — ED Triage Notes (Signed)
 Pt sts that she was leaving a resturaunt and fell of the curb. Pt sts that she just did not see the step. Pt sts that she hit the front left of her head. Pt is on blood thinners. Pt is A/Ox4 at this time.

## 2023-12-18 NOTE — Discharge Instructions (Addendum)
 Your CT scan was normal today.  You can continue to apply ice to the bump on your head this will help with the swelling. You can take tylenol as needed for pain.  Please return to the ED if you have any worsening symptoms like increasing headache, nausea, vomiting, blurry vision or balance issues.

## 2023-12-18 NOTE — ED Notes (Signed)
 Patient ambulated to and from hallway bathroom with a steady gait and use of a cane. Friend at bedside.

## 2024-06-29 ENCOUNTER — Other Ambulatory Visit: Payer: Self-pay | Admitting: Infectious Diseases

## 2024-06-29 DIAGNOSIS — R194 Change in bowel habit: Secondary | ICD-10-CM

## 2024-06-29 DIAGNOSIS — K529 Noninfective gastroenteritis and colitis, unspecified: Secondary | ICD-10-CM

## 2024-06-29 DIAGNOSIS — I4819 Other persistent atrial fibrillation: Secondary | ICD-10-CM

## 2024-06-29 DIAGNOSIS — R634 Abnormal weight loss: Secondary | ICD-10-CM

## 2024-07-07 ENCOUNTER — Ambulatory Visit
Admission: RE | Admit: 2024-07-07 | Discharge: 2024-07-07 | Disposition: A | Discharge status: Home / Self Care | Source: Ambulatory Visit | Attending: Infectious Diseases | Admitting: Infectious Diseases

## 2024-07-07 DIAGNOSIS — I4819 Other persistent atrial fibrillation: Secondary | ICD-10-CM | POA: Insufficient documentation

## 2024-07-07 DIAGNOSIS — R634 Abnormal weight loss: Secondary | ICD-10-CM | POA: Insufficient documentation

## 2024-07-07 DIAGNOSIS — R194 Change in bowel habit: Secondary | ICD-10-CM | POA: Insufficient documentation

## 2024-07-07 DIAGNOSIS — K529 Noninfective gastroenteritis and colitis, unspecified: Secondary | ICD-10-CM | POA: Diagnosis present

## 2024-07-07 MED ORDER — IOHEXOL 9 MG/ML PO SOLN
500.0000 mL | ORAL | Status: AC
  Administered 2024-07-07 (×2): 500 mL via ORAL

## 2024-07-07 MED ORDER — IOHEXOL 300 MG/ML  SOLN
80.0000 mL | Freq: Once | INTRAMUSCULAR | Status: AC | PRN
  Administered 2024-07-07: 80 mL via INTRAVENOUS

## 2024-07-10 ENCOUNTER — Emergency Department

## 2024-07-10 ENCOUNTER — Encounter: Payer: Self-pay | Admitting: Emergency Medicine

## 2024-07-10 ENCOUNTER — Other Ambulatory Visit: Payer: Self-pay

## 2024-07-10 ENCOUNTER — Observation Stay
Admission: EM | Admit: 2024-07-10 | Discharge: 2024-07-11 | Disposition: A | Discharge status: Home / Self Care | Attending: Internal Medicine | Admitting: Internal Medicine

## 2024-07-10 DIAGNOSIS — Z79899 Other long term (current) drug therapy: Secondary | ICD-10-CM | POA: Diagnosis not present

## 2024-07-10 DIAGNOSIS — Z7982 Long term (current) use of aspirin: Secondary | ICD-10-CM | POA: Insufficient documentation

## 2024-07-10 DIAGNOSIS — Z7901 Long term (current) use of anticoagulants: Secondary | ICD-10-CM | POA: Insufficient documentation

## 2024-07-10 DIAGNOSIS — I10 Essential (primary) hypertension: Secondary | ICD-10-CM | POA: Diagnosis not present

## 2024-07-10 DIAGNOSIS — I471 Supraventricular tachycardia, unspecified: Secondary | ICD-10-CM | POA: Diagnosis not present

## 2024-07-10 DIAGNOSIS — E039 Hypothyroidism, unspecified: Secondary | ICD-10-CM | POA: Diagnosis not present

## 2024-07-10 DIAGNOSIS — I480 Paroxysmal atrial fibrillation: Secondary | ICD-10-CM | POA: Insufficient documentation

## 2024-07-10 DIAGNOSIS — K529 Noninfective gastroenteritis and colitis, unspecified: Secondary | ICD-10-CM | POA: Insufficient documentation

## 2024-07-10 DIAGNOSIS — R002 Palpitations: Secondary | ICD-10-CM | POA: Diagnosis present

## 2024-07-10 DIAGNOSIS — R000 Tachycardia, unspecified: Secondary | ICD-10-CM

## 2024-07-10 DIAGNOSIS — Z79890 Hormone replacement therapy (postmenopausal): Secondary | ICD-10-CM | POA: Insufficient documentation

## 2024-07-10 LAB — CBC WITH DIFFERENTIAL/PLATELET
Abs Immature Granulocytes: 0.02 K/uL (ref 0.00–0.07)
Basophils Absolute: 0.1 K/uL (ref 0.0–0.1)
Basophils Relative: 1 %
Eosinophils Absolute: 0.2 K/uL (ref 0.0–0.5)
Eosinophils Relative: 3 %
HCT: 42.7 % (ref 36.0–46.0)
Hemoglobin: 14.2 g/dL (ref 12.0–15.0)
Immature Granulocytes: 0 %
Lymphocytes Relative: 28 %
Lymphs Abs: 1.8 K/uL (ref 0.7–4.0)
MCH: 30.5 pg (ref 26.0–34.0)
MCHC: 33.3 g/dL (ref 30.0–36.0)
MCV: 91.8 fL (ref 80.0–100.0)
Monocytes Absolute: 0.6 K/uL (ref 0.1–1.0)
Monocytes Relative: 9 %
Neutro Abs: 3.9 K/uL (ref 1.7–7.7)
Neutrophils Relative %: 59 %
Platelets: 180 K/uL (ref 150–400)
RBC: 4.65 MIL/uL (ref 3.87–5.11)
RDW: 11.3 % — ABNORMAL LOW (ref 11.5–15.5)
WBC: 6.5 K/uL (ref 4.0–10.5)
nRBC: 0 % (ref 0.0–0.2)

## 2024-07-10 LAB — COMPREHENSIVE METABOLIC PANEL WITH GFR
ALT: 14 U/L (ref 0–44)
AST: 23 U/L (ref 15–41)
Albumin: 4.1 g/dL (ref 3.5–5.0)
Alkaline Phosphatase: 124 U/L (ref 38–126)
Anion gap: 9 (ref 5–15)
BUN: 20 mg/dL (ref 8–23)
CO2: 24 mmol/L (ref 22–32)
Calcium: 9.5 mg/dL (ref 8.9–10.3)
Chloride: 108 mmol/L (ref 98–111)
Creatinine, Ser: 0.99 mg/dL (ref 0.44–1.00)
GFR, Estimated: 56 mL/min — ABNORMAL LOW (ref >60)
Glucose, Bld: 116 mg/dL — ABNORMAL HIGH (ref 70–99)
Potassium: 3.9 mmol/L (ref 3.5–5.1)
Sodium: 141 mmol/L (ref 135–145)
Total Bilirubin: 0.5 mg/dL (ref 0.0–1.2)
Total Protein: 7.2 g/dL (ref 6.5–8.1)

## 2024-07-10 LAB — TROPONIN I (HIGH SENSITIVITY): Troponin I (High Sensitivity): 16 ng/L (ref <18)

## 2024-07-10 LAB — PROTIME-INR
INR: 1.4 — ABNORMAL HIGH (ref 0.8–1.2)
Prothrombin Time: 18 s — ABNORMAL HIGH (ref 11.4–15.2)

## 2024-07-10 LAB — LIPASE, BLOOD: Lipase: 61 U/L — ABNORMAL HIGH (ref 11–51)

## 2024-07-10 LAB — BRAIN NATRIURETIC PEPTIDE: B Natriuretic Peptide: 462.5 pg/mL — ABNORMAL HIGH (ref 0.0–100.0)

## 2024-07-10 MED ORDER — METOPROLOL TARTRATE 25 MG PO TABS
25.0000 mg | ORAL_TABLET | Freq: Once | ORAL | Status: AC
  Administered 2024-07-10: 25 mg via ORAL
  Filled 2024-07-10: qty 1

## 2024-07-10 MED ORDER — SODIUM CHLORIDE 0.9 % IV BOLUS
500.0000 mL | Freq: Once | INTRAVENOUS | Status: AC
  Administered 2024-07-10: 500 mL via INTRAVENOUS

## 2024-07-10 MED ORDER — ADENOSINE 6 MG/2ML IV SOLN
INTRAVENOUS | Status: AC
  Filled 2024-07-10: qty 2

## 2024-07-10 MED ORDER — ADENOSINE 12 MG/4ML IV SOLN
INTRAVENOUS | Status: AC
  Filled 2024-07-10: qty 4

## 2024-07-10 MED ORDER — METOPROLOL TARTRATE 25 MG PO TABS: 25.0000 mg | ORAL_TABLET | Freq: Two times a day (BID) | ORAL | 0 refills | Status: DC

## 2024-07-10 MED ORDER — ADENOSINE 6 MG/2ML IV SOLN
6.0000 mg | Freq: Once | INTRAVENOUS | Status: AC
  Administered 2024-07-10: 6 mg via INTRAVENOUS

## 2024-07-10 NOTE — ED Provider Notes (Signed)
 [facility] Provider Note    Event Date/Time   First MD Initiated Contact with Patient 07/10/24 2217     (approximate)   History   Tachycardia  BIBA with HR at home 133 then 186 and back down to 130.  Converted to ST and back to SVT. BP 200s/100s  Ablation around April 2025.     HPI Katrina Beasley is a 83 y.o. female PMH paroxysmal A-fib status post ablation now on Eliquis , CHF presents for evaluation of palpitations -Present since about 8 AM, did apparently briefly go into sinus rhythm after vagal maneuver with EMS show return to SVT -States it feels like there is a party in her chest though denies any pain -Is taking her Eliquis  -No fever, cough, shortness of breath, abdominal pain, nausea/vomiting -Has been having some issues with chronic diarrhea     Physical Exam   Triage Vital Signs: ED Triage Vitals [07/10/24 2206]  Encounter Vitals Group     BP (!) 173/115     Girls Systolic BP Percentile      Girls Diastolic BP Percentile      Boys Systolic BP Percentile      Boys Diastolic BP Percentile      Pulse Rate (!) 180     Resp 15     Temp 98 F (36.7 C)     Temp Source Oral     SpO2 100 %     Weight      Height      Head Circumference      Peak Flow      Pain Score      Pain Loc      Pain Education      Exclude from Growth Chart     Most recent vital signs: Vitals:   07/10/24 2326 07/10/24 2359  BP:  (!) 194/104  Pulse: (!) 104 (!) 107  Resp: (!) 23   Temp:    SpO2: 100%      General: Awake, no distress.  CV:  Good peripheral perfusion.  Tachycardic, RP 2+ Resp:  Normal effort. CTAB Abd:  No distention. Nontender to deep palpation throughout Other:  No lower extremity edema   ED Results / Procedures / Treatments   Labs (all labs ordered are listed, but only abnormal results are displayed) Labs Reviewed  CBC WITH DIFFERENTIAL/PLATELET - Abnormal; Notable for the following components:      Result Value   RDW  11.3 (*)    All other components within normal limits  BRAIN NATRIURETIC PEPTIDE - Abnormal; Notable for the following components:   B Natriuretic Peptide 462.5 (*)    All other components within normal limits  PROTIME-INR - Abnormal; Notable for the following components:   Prothrombin Time 18.0 (*)    INR 1.4 (*)    All other components within normal limits  COMPREHENSIVE METABOLIC PANEL WITH GFR - Abnormal; Notable for the following components:   Glucose, Bld 116 (*)    GFR, Estimated 56 (*)    All other components within normal limits  LIPASE, BLOOD - Abnormal; Notable for the following components:   Lipase 61 (*)    All other components within normal limits  TROPONIN I (HIGH SENSITIVITY)  TROPONIN I (HIGH SENSITIVITY)     EKG  Initial ecg = SVT, rate 181, no gross ST elevation, some mild diffuse depressions that I suspect are rate related, left axis deviation.  Abnormal EKG.  Repeat EKG = sinus tachycardia, rate  117, no gross ST elevation or depression, no significant repolarization abnormality, left axis deviation, normal intervals.  No clear evidence of ischemia nor arrhythmia on my interpretation.   RADIOLOGY Chest x-ray interpreted by myself and radiology report reviewed.  No acute pathology identified.    PROCEDURES:  Critical Care performed: Yes, see critical care procedure note(s)  .Critical Care  Performed by: Clarine Ozell LABOR, MD Authorized by: Clarine Ozell LABOR, MD   Critical care provider statement:    Critical care time (minutes):  30   Critical care time was exclusive of:  Separately billable procedures and treating other patients   Critical care was necessary to treat or prevent imminent or life-threatening deterioration of the following conditions:  Cardiac failure   Critical care was time spent personally by me on the following activities:  Development of treatment plan with patient or surrogate, discussions with consultants, evaluation of patient's  response to treatment, examination of patient, ordering and review of laboratory studies, ordering and review of radiographic studies, ordering and performing treatments and interventions, pulse oximetry, re-evaluation of patient's condition and review of old charts   I assumed direction of critical care for this patient from another provider in my specialty: no      MEDICATIONS ORDERED IN ED: Medications  adenosine  (ADENOCARD ) 6 MG/2ML injection (has no administration in time range)  adenosine  (ADENOCARD ) 12 MG/4ML injection (has no administration in time range)  sodium chloride  0.9 % bolus 500 mL (0 mLs Intravenous Stopped 07/10/24 2300)  adenosine  (ADENOCARD ) 6 MG/2ML injection 6 mg (6 mg Intravenous Given 07/10/24 2251)  metoprolol  tartrate (LOPRESSOR ) tablet 25 mg (25 mg Oral Given 07/10/24 2359)     IMPRESSION / MDM / ASSESSMENT AND PLAN / ED COURSE  I reviewed the triage vital signs and the nursing notes.                              DDX/MDM/AP: Differential diagnosis includes, but is not limited to, primary arrhythmia, doubt underlying PE given the patient is already anticoagulated, consider underlying anemia or electrolyte abnormality, ACS, dehydration, clinically doubt CHF exacerbation.  Plan: - Labs - EKG - Chest x-ray -Gentle IV fluid -Vagal maneuver initially successful but quickly reverted to SVT, escalated to adenosine   Patient's presentation is most consistent with acute presentation with potential threat to life or bodily function.  The patient is on the cardiac monitor to evaluate for evidence of arrhythmia and/or significant heart rate changes.  ED course below.  Lab overall unremarkable, BNP somewhat elevated though lower than prior.  Patient with no recurrence of SVT after adenosine  though did have multiple episodes of recurrent SVT prior to administration.  Case discussed with on-call cardiologist who recommends discharge home on metoprolol  and they will follow  patient in clinic.  Patient is amenable to this plan.  Signed out to overnight ED provider pending repeat troponin and-if stable, stable for discharge from my perspective.  Clinical Course as of 07/11/24 0000  Sat Jul 10, 2024  2238 Cbc wnl [MM]  2310 CMP reviewed, overall unremarkable Lipase are unremarkable  BNP elevated though improved from prior [MM]  2312 CXR: IMPRESSION: Chronic changes.  No active disease.   [MM]  2321 Trop wnl [MM]  2342 Paging cardiology to discuss [MM]  2347 D/w Dr. Dewane of cardiology Believes patient is stable for outpatient follow-up, does recommend starting metoprolol  and they will follow-up as outpatient No indication for admission at this time  Will load with metoprolol  and plan for discharge home. [MM]  2356 Patient agrees with plan.  Will trend troponin, assuming remains stable she is stable for discharge from my perspective.  Plan for follow-up with her cardiologist.  ED return precautions in place.  Patient agrees with plan. [MM]    Clinical Course User Index [MM] Clarine Ozell LABOR, MD     FINAL CLINICAL IMPRESSION(S) / ED DIAGNOSES   Final diagnoses:  SVT (supraventricular tachycardia) (HCC)  Sinus tachycardia     Rx / DC Orders   ED Discharge Orders          Ordered    metoprolol  tartrate (LOPRESSOR ) 25 MG tablet  2 times daily        07/10/24 2349             Note:  This document was prepared using Dragon voice recognition software and may include unintentional dictation errors.   Clarine Ozell LABOR, MD 07/11/24 0000

## 2024-07-10 NOTE — ED Triage Notes (Signed)
 BIBA with HR at home 133 then 186 and back down to 130.  Converted to ST and back to SVT. BP 200s/100s  Ablation around April 2025.

## 2024-07-10 NOTE — Discharge Instructions (Addendum)
 Your evaluation in the emergency department was notable for a new abnormal heart rhythm called supraventricular tachycardia.  We were able to return this to a normal heart rhythm.  I discussed your case with a cardiologist, and they recommend starting you on a new medication (metoprolol ) --please take this as prescribed and follow-up with your primary care doctor and cardiologist as soon as possible.  Return to the emergency department with any new or worsening symptoms.

## 2024-07-10 NOTE — ED Notes (Signed)
 Assisted pt to restroom, provided underwear, pad and scrub bottoms along with pt belongings bag.

## 2024-07-10 NOTE — ED Notes (Signed)
 Adenosine  held as HR down to 90's

## 2024-07-11 DIAGNOSIS — E039 Hypothyroidism, unspecified: Secondary | ICD-10-CM | POA: Diagnosis not present

## 2024-07-11 DIAGNOSIS — I10 Essential (primary) hypertension: Secondary | ICD-10-CM | POA: Diagnosis not present

## 2024-07-11 DIAGNOSIS — I471 Supraventricular tachycardia, unspecified: Secondary | ICD-10-CM | POA: Diagnosis not present

## 2024-07-11 DIAGNOSIS — I480 Paroxysmal atrial fibrillation: Secondary | ICD-10-CM | POA: Diagnosis not present

## 2024-07-11 LAB — CBC
HCT: 41.4 % (ref 36.0–46.0)
Hemoglobin: 13.9 g/dL (ref 12.0–15.0)
MCH: 30.8 pg (ref 26.0–34.0)
MCHC: 33.6 g/dL (ref 30.0–36.0)
MCV: 91.6 fL (ref 80.0–100.0)
Platelets: 174 K/uL (ref 150–400)
RBC: 4.52 MIL/uL (ref 3.87–5.11)
RDW: 11.4 % — ABNORMAL LOW (ref 11.5–15.5)
WBC: 5.9 K/uL (ref 4.0–10.5)
nRBC: 0 % (ref 0.0–0.2)

## 2024-07-11 LAB — BASIC METABOLIC PANEL WITH GFR
Anion gap: 12 (ref 5–15)
BUN: 17 mg/dL (ref 8–23)
CO2: 23 mmol/L (ref 22–32)
Calcium: 9.5 mg/dL (ref 8.9–10.3)
Chloride: 108 mmol/L (ref 98–111)
Creatinine, Ser: 0.67 mg/dL (ref 0.44–1.00)
GFR, Estimated: >60 mL/min (ref >60)
Glucose, Bld: 100 mg/dL — ABNORMAL HIGH (ref 70–99)
Potassium: 3.9 mmol/L (ref 3.5–5.1)
Sodium: 143 mmol/L (ref 135–145)

## 2024-07-11 LAB — TROPONIN I (HIGH SENSITIVITY): Troponin I (High Sensitivity): 40 ng/L — ABNORMAL HIGH (ref <18)

## 2024-07-11 MED ORDER — TIMOLOL MALEATE 0.5 % OP SOLN
1.0000 [drp] | Freq: Two times a day (BID) | OPHTHALMIC | Status: DC
  Filled 2024-07-11 (×2): qty 5

## 2024-07-11 MED ORDER — LATANOPROST 0.005 % OP SOLN
1.0000 [drp] | Freq: Every day | OPHTHALMIC | Status: DC
Start: 2024-07-11 — End: 2024-07-11
  Filled 2024-07-11: qty 2.5

## 2024-07-11 MED ORDER — ONDANSETRON HCL 4 MG PO TABS: 4.0000 mg | ORAL_TABLET | Freq: Four times a day (QID) | ORAL | Status: DC | PRN

## 2024-07-11 MED ORDER — METOPROLOL TARTRATE 25 MG PO TABS: 25.0000 mg | ORAL_TABLET | Freq: Two times a day (BID) | ORAL | 0 refills | Status: DC

## 2024-07-11 MED ORDER — LEVOTHYROXINE SODIUM 112 MCG PO TABS: 112.0000 ug | ORAL_TABLET | Freq: Every day | ORAL | 0 refills | Status: AC

## 2024-07-11 MED ORDER — VITAMIN E 180 MG (400 UNIT) PO CAPS
400.0000 [IU] | ORAL_CAPSULE | Freq: Every day | ORAL | Status: DC
  Administered 2024-07-11: 400 [IU] via ORAL
  Filled 2024-07-11: qty 1

## 2024-07-11 MED ORDER — ASPIRIN 81 MG PO CHEW
324.0000 mg | CHEWABLE_TABLET | Freq: Once | ORAL | Status: AC
  Administered 2024-07-11: 324 mg via ORAL
  Filled 2024-07-11: qty 4

## 2024-07-11 MED ORDER — ACETAMINOPHEN 325 MG PO TABS
650.0000 mg | ORAL_TABLET | Freq: Four times a day (QID) | ORAL | Status: DC | PRN
  Administered 2024-07-11: 650 mg via ORAL
  Filled 2024-07-11: qty 2

## 2024-07-11 MED ORDER — SODIUM CHLORIDE 0.9 % IV SOLN: INTRAVENOUS | Status: DC

## 2024-07-11 MED ORDER — SPIRONOLACTONE 25 MG PO TABS
25.0000 mg | ORAL_TABLET | Freq: Every day | ORAL | Status: DC
  Administered 2024-07-11: 25 mg via ORAL
  Filled 2024-07-11: qty 1

## 2024-07-11 MED ORDER — FUROSEMIDE 40 MG PO TABS: 20.0000 mg | ORAL_TABLET | Freq: Every day | ORAL | Status: DC | PRN

## 2024-07-11 MED ORDER — METOPROLOL TARTRATE 50 MG PO TABS
50.0000 mg | ORAL_TABLET | Freq: Two times a day (BID) | ORAL | Status: DC
Start: 2024-07-11 — End: 2024-07-11
  Administered 2024-07-11: 50 mg via ORAL
  Filled 2024-07-11: qty 1

## 2024-07-11 MED ORDER — EMPAGLIFLOZIN 10 MG PO TABS
10.0000 mg | ORAL_TABLET | Freq: Every day | ORAL | Status: DC
  Filled 2024-07-11: qty 1

## 2024-07-11 MED ORDER — HYDRALAZINE HCL 20 MG/ML IJ SOLN
5.0000 mg | Freq: Once | INTRAMUSCULAR | Status: AC
  Administered 2024-07-11: 5 mg via INTRAVENOUS
  Filled 2024-07-11: qty 1

## 2024-07-11 MED ORDER — MIRTAZAPINE 15 MG PO TABS: 7.5000 mg | ORAL_TABLET | Freq: Every day | ORAL | Status: DC

## 2024-07-11 MED ORDER — BRIMONIDINE TARTRATE-TIMOLOL 0.2-0.5 % OP SOLN
1.0000 [drp] | Freq: Two times a day (BID) | OPHTHALMIC | Status: DC
Start: 2024-07-11 — End: 2024-07-11

## 2024-07-11 MED ORDER — LOSARTAN POTASSIUM 50 MG PO TABS
25.0000 mg | ORAL_TABLET | Freq: Every day | ORAL | Status: DC
  Administered 2024-07-11: 25 mg via ORAL
  Filled 2024-07-11: qty 1

## 2024-07-11 MED ORDER — METOPROLOL TARTRATE 25 MG PO TABS: 25.0000 mg | ORAL_TABLET | Freq: Two times a day (BID) | ORAL | Status: DC

## 2024-07-11 MED ORDER — ONDANSETRON HCL 4 MG/2ML IJ SOLN: 4.0000 mg | Freq: Four times a day (QID) | INTRAMUSCULAR | Status: DC | PRN

## 2024-07-11 MED ORDER — APIXABAN 2.5 MG PO TABS
2.5000 mg | ORAL_TABLET | Freq: Two times a day (BID) | ORAL | Status: DC
Start: 2024-07-11 — End: 2024-07-11
  Administered 2024-07-11: 2.5 mg via ORAL
  Filled 2024-07-11: qty 1

## 2024-07-11 MED ORDER — BRIMONIDINE TARTRATE 0.2 % OP SOLN
1.0000 [drp] | Freq: Two times a day (BID) | OPHTHALMIC | Status: DC
  Filled 2024-07-11 (×2): qty 5

## 2024-07-11 MED ORDER — PROSIGHT PO TABS
1.0000 | ORAL_TABLET | Freq: Two times a day (BID) | ORAL | Status: DC
  Administered 2024-07-11: 1 via ORAL
  Filled 2024-07-11: qty 1

## 2024-07-11 MED ORDER — ACETAMINOPHEN 325 MG RE SUPP: 650.0000 mg | Freq: Four times a day (QID) | RECTAL | Status: DC | PRN

## 2024-07-11 MED ORDER — LEVOTHYROXINE SODIUM 112 MCG PO TABS
112.0000 ug | ORAL_TABLET | Freq: Every day | ORAL | Status: DC
  Administered 2024-07-11: 112 ug via ORAL
  Filled 2024-07-11: qty 1

## 2024-07-11 MED ORDER — MAGNESIUM HYDROXIDE 400 MG/5ML PO SUSP: 30.0000 mL | Freq: Every day | ORAL | Status: DC | PRN

## 2024-07-11 MED ORDER — TRAZODONE HCL 50 MG PO TABS: 25.0000 mg | ORAL_TABLET | Freq: Every evening | ORAL | Status: DC | PRN

## 2024-07-11 MED ORDER — METOPROLOL TARTRATE 25 MG PO TABS
25.0000 mg | ORAL_TABLET | Freq: Two times a day (BID) | ORAL | 11 refills | Status: AC
Start: 2024-07-11 — End: 2025-07-11

## 2024-07-11 NOTE — Assessment & Plan Note (Signed)
-   Will continue Synthroid and check TSH.

## 2024-07-11 NOTE — Assessment & Plan Note (Addendum)
-   Will continue Eliquis  and Lopressor  as well as Cardizem  CD and Tambocor .

## 2024-07-11 NOTE — Progress Notes (Signed)
 Patient ID: Katrina Beasley, female   DOB: May 12, 1941, 83 y.o.   MRN: [id number]  This is one of my clinic patients seen by my partner Dr. Dewane in consultation Patient presented with what appears to be SVT/versus atrial flutter treated with adenosine  and converted Patient now in sinus bradycardia reasonably comfortable hemodynamically stable Would recommend rate control with low-dose metoprolol  25 mg twice a day Goal is to keep her heart rate under 100 And hopefully blood pressure will stay systolic greater than 100  I have discussed the case with the patient and family I think is reasonable to have her discharge today and follow-up in the office with me tomorrow Have contacted Dr. Custovic and she is aware I have given the patient my contact information should any unforeseen difficulties arise  Thanks  Mikeyla Music

## 2024-07-11 NOTE — Consult Note (Signed)
 [facility] CLINIC CARDIOLOGY CONSULT NOTE       Patient ID: Katrina Beasley MRN: [id number] DOB/AGE: 1941/06/10 83 y.o.  Admit date: 07/10/2024 Referring Physician Dr Tobie Primary Physician Epifanio Alm SQUIBB, MD Primary Cardiologist Dr Florencio Reason for Consultation SVT  HPI: Katrina Beasley is a 83 y.o. female  with a past medical history of Afib s/p ablation over the summer who presented to the ED on 07/10/2024 for new onset SVT. This morning, she is rate controlled, around 60 bpm. She denies chest pain, shortness of breath, palpitations, diaphoresis, syncope. She has gone into SVT a few times this morning. She has not had any recurrence of Afib since her ablation.  Review of systems complete and found to be negative unless listed above     Past Medical History:  Diagnosis Date   Hypothyroidism    IBS (irritable bowel syndrome)    Paroxysmal A-fib (HCC)     Past Surgical History:  Procedure Laterality Date   BREAST BIOPSY Left 1980   needle bx x 2 Benign per pt    (Not in a hospital admission)  Social History   Socioeconomic History   Marital status: Single    Spouse name: Not on file   Number of children: Not on file   Years of education: Not on file   Highest education level: Not on file  Occupational History   Not on file  Tobacco Use   Smoking status: Never   Smokeless tobacco: Never  Substance and Sexual Activity   Alcohol use: Not on file   Drug use: Not on file   Sexual activity: Not on file  Other Topics Concern   Not on file  Social History Narrative   Not on file   Social Drivers of Health   Financial Resource Strain: Low Risk  (06/28/2024)   Received from [facility]   Overall Financial Resource Strain (CARDIA)    Difficulty of Paying Living Expenses: Not hard at all  Food Insecurity: No Food Insecurity (06/28/2024)   Received from [facility]   Hunger Vital Sign    Within the past 12 months, you worried that  your food would run out before you got the money to buy more.: Never true    Within the past 12 months, the food you bought just didn't last and you didn't have money to get more.: Never true  Transportation Needs: No Transportation Needs (06/28/2024)   Received from [facility] - Transportation    In the past 12 months, has lack of transportation kept you from medical appointments or from getting medications?: No    Lack of Transportation (Non-Medical): No  Physical Activity: Not on file  Stress: Not on file  Social Connections: Unknown (03/04/2022)   Received from [facility]   Social Network    Social Network: Not on file  Intimate Partner Violence: Not At Risk (10/14/2023)   Humiliation, Afraid, Rape, and Kick questionnaire    Fear of Current or Ex-Partner: No    Emotionally Abused: No    Physically Abused: No    Sexually Abused: No    Family History  Problem Relation Age of Onset   Breast cancer Mother 31     Vitals:   07/11/24 0510 07/11/24 0730 07/11/24 0811 07/11/24 0900  BP: (!) 154/90 (!) 158/88    Pulse: 87 85  88  Resp: 18 15  20   Temp:   98 F (36.7 C)  TempSrc:   Oral   SpO2: 100% 98%  100%  Weight:      Height:        PHYSICAL EXAM General: awake, well nourished, in no acute distress. HEENT: Normocephalic and atraumatic. Neck: No JVD.  Lungs: Normal respiratory effort. Clear bilaterally to auscultation. No wheezes, crackles, rhonchi.  Heart: HRRR. Normal S1 and S2 without gallops or murmurs.  Abdomen: Non-distended appearing.  Msk: Normal strength and tone for age. Extremities: Warm and well perfused. No clubbing, cyanosis. no edema.  Neuro: Alert and oriented X 3. Psych: Answers questions appropriately.   Labs: Basic Metabolic Panel: Recent Labs    07/10/24 2210 07/11/24 0820  NA 141 143  K 3.9 3.9  CL 108 108  CO2 24 23  GLUCOSE 116* 100*  BUN 20 17  CREATININE 0.99 0.67  CALCIUM 9.5 9.5   Liver Function  Tests: Recent Labs    07/10/24 2210  AST 23  ALT 14  ALKPHOS 124  BILITOT 0.5  PROT 7.2  ALBUMIN 4.1   Recent Labs    07/10/24 2210  LIPASE 61*   CBC: Recent Labs    07/10/24 2210 07/11/24 0820  WBC 6.5 5.9  NEUTROABS 3.9  --   HGB 14.2 13.9  HCT 42.7 41.4  MCV 91.8 91.6  PLT 180 174   Cardiac Enzymes: Recent Labs    07/10/24 2210 07/11/24 0006  TROPONINIHS 16 40*   BNP: Recent Labs    07/10/24 2210  BNP 462.5*   D-Dimer: No results for input(s): DDIMER in the last 72 hours. Hemoglobin A1C: No results for input(s): HGBA1C in the last 72 hours. Fasting Lipid Panel: No results for input(s): CHOL, HDL, LDLCALC, TRIG, CHOLHDL, LDLDIRECT in the last 72 hours. Thyroid  Function Tests: No results for input(s): TSH, T4TOTAL, T3FREE, THYROIDAB in the last 72 hours.  Invalid input(s): FREET3 Anemia Panel: No results for input(s): VITAMINB12, FOLATE, FERRITIN, TIBC, IRON, RETICCTPCT in the last 72 hours.   Radiology: DG Chest Portable 1 View Result Date: 07/10/2024 CLINICAL DATA:  Palpitations EXAM: PORTABLE CHEST 1 VIEW COMPARISON:  None Available. FINDINGS: Heart and mediastinal contours within normal limits. No acute confluent airspace opacities or effusions. Chronic changes/scarring in the lungs. No acute bony abnormality. IMPRESSION: Chronic changes.  No active disease. Electronically Signed   By: Franky Crease M.D.   On: 07/10/2024 23:05   CT ABDOMEN PELVIS W CONTRAST Result Date: 07/07/2024 CLINICAL DATA:  Left lower quadrant pain, diarrhea, and weight loss for 3 months. EXAM: CT ABDOMEN AND PELVIS WITH CONTRAST TECHNIQUE: Multidetector CT imaging of the abdomen and pelvis was performed using the standard protocol following bolus administration of intravenous contrast. RADIATION DOSE REDUCTION: This exam was performed according to the departmental dose-optimization program which includes automated exposure control, adjustment  of the mA and/or kV according to patient size and/or use of iterative reconstruction technique. CONTRAST:  80mL OMNIPAQUE  IOHEXOL  300 MG/ML  SOLN COMPARISON:  Noncontrast CT on 10/14/2023 FINDINGS: Lower Chest: No acute findings. Chronic bibasilar scarring and subpleural fibrosis. Hepatobiliary: No suspicious hepatic masses identified. Prior cholecystectomy. No evidence of biliary obstruction. Pancreas:  No mass or inflammatory changes. Spleen: Within normal limits in size and appearance. Adrenals/Urinary Tract: No suspicious masses identified. Small right renal cysts again noted (No followup imaging is recommended) . No evidence of ureteral calculi or hydronephrosis. Stomach/Bowel: Rectosigmoid surgical anastomosis again noted. No evidence of obstruction, inflammatory process or abnormal fluid collections. Vascular/Lymphatic: No pathologically enlarged lymph nodes. No acute vascular findings.  Reproductive: Prior hysterectomy noted. Adnexal regions are unremarkable in appearance. Other:  None. Musculoskeletal: No suspicious bone lesions identified. Left hip prosthesis again noted. IMPRESSION: No acute findings or other significant abnormality. Electronically Signed   By: Norleen DELENA Kil M.D.   On: 07/07/2024 16:33    ECHO pending  TELEMETRY reviewed by me [facility]) 07/11/2024 : sinus bradycardia  EKG reviewed by me: SVT rate 181  Data reviewed by me [facility]) 07/11/2024: last 24h vitals tele labs imaging I/O provider notes  Principal Problem:   SVT (supraventricular tachycardia) (HCC) Active Problems:   Hypothyroidism   Essential hypertension   Paroxysmal atrial fibrillation (HCC)    ASSESSMENT AND PLAN:   SVT, resolved with adenosine  Patient is s/p Afib ablation over the summer with Dr Melchor. Currently sinus bradycardia. Will see EP this week for potential SVT ablation. Rates are controlled with metoprolol , patient stable for d/c home and she will Dr Florencio in office tomorrow at 3:30 pm. Metoprolol   25 mg BID sent to patient pharmacy. Can take additional metoprolol  for breakthrough SVT. Discussed vagal maneuvers with patient and family.    Signed: Jihan Mellette, DO 07/11/2024, 10:48 AM [facility] Cardiology

## 2024-07-11 NOTE — Assessment & Plan Note (Addendum)
-   This is responded to adenosine . - She will be admitted to observation progressive unit bed. - She will be monitored for further arrhythmias. - Will optimize electrolytes. - Will continue Lopressor . - Cardiology consult will be obtained. - I notified Dr. Custovic about the patient.

## 2024-07-11 NOTE — Assessment & Plan Note (Signed)
-   We will continue antihypertensive therapy.

## 2024-07-11 NOTE — ED Provider Notes (Signed)
 12:14 AM  Assumed care at shift change.  Patient here with recurrent SVT.  Now in sinus tachycardia.  First troponin negative.  Second pending.  If second negative, discharged home with outpatient follow-up.   1:01 AM  Pt's repeat troponin is elevated to 40.  Likely demand ischemia from tachycardia.  Still hypertensive here but heart rates have improved to the 80s to 90s in a sinus rhythm.  Will give IV hydralazine  and reassess.   1:47 AM  Pt's blood pressure has improved.  Recommended admission for observation.  Patient and family are in agreement with this plan.   Consulted and discussed patient's case with hospitalist, Dr. Lawence.  I have recommended admission and consulting physician agrees and will place admission orders.  Patient (and family if present) agree with this plan.   I reviewed all nursing notes, vitals, pertinent previous records.  All labs, EKGs, imaging ordered have been independently reviewed and interpreted by myself.    Mohammedali Bedoy, Josette SAILOR, DO 07/11/24 [phone]

## 2024-07-11 NOTE — Discharge Summary (Signed)
 Physician Discharge Summary   Patient: Katrina Beasley MRN: [id number] DOB: 07-07-1941  Admit date:     07/10/2024  Discharge date: 07/11/24  Discharge Physician: Leita Blanch   PCP: Epifanio Alm SQUIBB, MD   Recommendations at discharge:   follow-up Dr. Florencio 22nd September 3:30 PM follow-up PCP in 1 to 2 week  Discharge Diagnoses: Principal Problem:   SVT (supraventricular tachycardia) [facility]) Active Problems:   Essential hypertension   Hypothyroidism   Paroxysmal atrial fibrillation (HCC)   Katrina Beasley is a 83 y.o. Caucasian female with medical history significant for hypothyroidism, paroxysmal atrial fibrillation and IBS, who presented to the emergency room with acute onset of a feeling of dancing going on in the chest with palpitations without chest pain.   In the ER she was found of heartrate of 182. Received 6 mg adenosine   SVT --recieved adenosine --now SR -- seen by Dr. Florencio and Dr. Dewane -- patient currently in sinus rhythm -- recommends metoprolol  25 mg BID and eliquis  -- patient will follow-up with Dr. Florencio tomorrow -- she has some of for cardiac meds like spironolactone  and losartan  which patient tells me she does not take it although med rec shows she does. I have asked patient to review with Dr. Florencio cardiac meds  Hypertension -- on beta-blocker  Hypothyroidism -- continue Synthroid   Discharge plan discussed with patient and daughter at bedside.     Consultants: [facility] cardiology  Disposition: Home Diet recommendation:  Discharge Diet Orders (From admission, onward)     Start     Ordered   07/11/24 0000  Diet - low sodium heart healthy        07/11/24 1312   07/11/24 0000  Diet - low sodium heart healthy        07/11/24 1351           Cardiac diet DISCHARGE MEDICATION: Allergies as of 07/11/2024       Reactions   Cyclosporine Other (See Comments), Rash   Burning   Sulfa Antibiotics Itching   Azithromycin Diarrhea    Exfoliative mucosal rash.        Medication List     STOP taking these medications    Jardiance  10 MG Tabs tablet Generic drug: empagliflozin    losartan  25 MG tablet Commonly known as: COZAAR    ondansetron  4 MG disintegrating tablet Commonly known as: ZOFRAN -ODT   spironolactone  25 MG tablet Commonly known as: ALDACTONE        TAKE these medications    apixaban  2.5 MG Tabs tablet Commonly known as: ELIQUIS  Take 1 tablet (2.5 mg total) by mouth 2 (two) times daily.   Combigan  0.2-0.5 % ophthalmic solution Generic drug: brimonidine -timolol  Place 1 drop into both eyes every 12 (twelve) hours.   furosemide  20 MG tablet Commonly known as: LASIX  Take 20 mg by mouth daily as needed.   latanoprost  0.005 % ophthalmic solution Commonly known as: XALATAN  Place 1 drop into both eyes at bedtime.   levothyroxine  112 MCG tablet Commonly known as: SYNTHROID  Take 1 tablet (112 mcg total) by mouth daily before breakfast. What changed:  medication strength how much to take   metoprolol  tartrate 25 MG tablet Commonly known as: LOPRESSOR  Take 1 tablet (25 mg total) by mouth 2 (two) times daily. What changed:  medication strength how much to take   mirtazapine  7.5 MG tablet Commonly known as: REMERON  Take 7.5 mg by mouth at bedtime.   PRESERVISION AREDS 2+MULTI VIT PO Take by mouth 2 (two) times daily.  triamcinolone ointment 0.1 % Commonly known as: KENALOG Apply 1 Application topically 2 (two) times daily as needed.   Vitamin E /D-Alpha Natural 268 MG (400 UNIT) Caps Generic drug: Vitamin E  Take 400 Units by mouth daily.        Follow-up Information     Epifanio Alm SQUIBB, MD. Schedule an appointment as soon as possible for a visit .   Specialty: Infectious Diseases Contact information: [address] [place] [address] [phone]         [facility] Emergency Department at [facility] Go to .   Specialty: Emergency Medicine Why:  As needed, If symptoms worsen Contact information: [address] [place]  [address] [phone]        Florencio Cara BIRCH, MD. Go on 07/12/2024.   Specialties: Cardiology, Internal Medicine Why: at 3:30 pm Contact information: [address] [place] [address] [phone]                  Condition at discharge: fair  The results of significant diagnostics from this hospitalization (including imaging, microbiology, ancillary and laboratory) are listed below for reference.   Imaging Studies: DG Chest Portable 1 View Result Date: 07/10/2024 CLINICAL DATA:  Palpitations EXAM: PORTABLE CHEST 1 VIEW COMPARISON:  None Available. FINDINGS: Heart and mediastinal contours within normal limits. No acute confluent airspace opacities or effusions. Chronic changes/scarring in the lungs. No acute bony abnormality. IMPRESSION: Chronic changes.  No active disease. Electronically Signed   By: Franky Crease M.D.   On: 07/10/2024 23:05   CT ABDOMEN PELVIS W CONTRAST Result Date: 07/07/2024 CLINICAL DATA:  Left lower quadrant pain, diarrhea, and weight loss for 3 months. EXAM: CT ABDOMEN AND PELVIS WITH CONTRAST TECHNIQUE: Multidetector CT imaging of the abdomen and pelvis was performed using the standard protocol following bolus administration of intravenous contrast. RADIATION DOSE REDUCTION: This exam was performed according to the departmental dose-optimization program which includes automated exposure control, adjustment of the mA and/or kV according to patient size and/or use of iterative reconstruction technique. CONTRAST:  80mL OMNIPAQUE  IOHEXOL  300 MG/ML  SOLN COMPARISON:  Noncontrast CT on 10/14/2023 FINDINGS: Lower Chest: No acute findings. Chronic bibasilar scarring and subpleural fibrosis. Hepatobiliary: No suspicious hepatic masses identified. Prior cholecystectomy. No evidence of biliary obstruction. Pancreas:  No mass or inflammatory changes. Spleen:  Within normal limits in size and appearance. Adrenals/Urinary Tract: No suspicious masses identified. Small right renal cysts again noted (No followup imaging is recommended) . No evidence of ureteral calculi or hydronephrosis. Stomach/Bowel: Rectosigmoid surgical anastomosis again noted. No evidence of obstruction, inflammatory process or abnormal fluid collections. Vascular/Lymphatic: No pathologically enlarged lymph nodes. No acute vascular findings. Reproductive: Prior hysterectomy noted. Adnexal regions are unremarkable in appearance. Other:  None. Musculoskeletal: No suspicious bone lesions identified. Left hip prosthesis again noted. IMPRESSION: No acute findings or other significant abnormality. Electronically Signed   By: Norleen DELENA Kil M.D.   On: 07/07/2024 16:33    Microbiology: Results for orders placed or performed during the hospital encounter of 10/14/23  Urine Culture     Status: Abnormal   Collection Time: 10/14/23  1:06 PM   Specimen: Urine, Clean Catch  Result Value Ref Range Status   Specimen Description   Final    URINE, CLEAN CATCH Performed at [facility], [address] [place], [place] [address]    Special Requests   Final    NONE Performed at [facility], [address] [place], [place] [address]  Culture >=100,000 COLONIES/mL KLEBSIELLA PNEUMONIAE (A)  Final   Report Status 10/16/2023 FINAL  Final   Organism ID, Bacteria KLEBSIELLA PNEUMONIAE (A)  Final      Susceptibility   Klebsiella pneumoniae - MIC*    AMPICILLIN >=32 RESISTANT Resistant     CEFAZOLIN <=4 SENSITIVE Sensitive     CEFEPIME <=0.12 SENSITIVE Sensitive     CEFTRIAXONE  <=0.25 SENSITIVE Sensitive     CIPROFLOXACIN <=0.25 SENSITIVE Sensitive     GENTAMICIN <=1 SENSITIVE Sensitive     IMIPENEM <=0.25 SENSITIVE Sensitive     NITROFURANTOIN 256 RESISTANT Resistant     TRIMETH/SULFA <=20 SENSITIVE Sensitive     AMPICILLIN/SULBACTAM >=32 RESISTANT Resistant     PIP/TAZO 64  INTERMEDIATE Intermediate ug/mL    * >=100,000 COLONIES/mL KLEBSIELLA PNEUMONIAE  MRSA Next Gen by PCR, Nasal     Status: None   Collection Time: 10/15/23  8:50 PM   Specimen: Nasal Mucosa; Nasal Swab  Result Value Ref Range Status   MRSA by PCR Next Gen NOT DETECTED NOT DETECTED Final    Comment: (NOTE) The GeneXpert MRSA Assay (FDA approved for NASAL specimens only), is one component of a comprehensive MRSA colonization surveillance program. It is not intended to diagnose MRSA infection nor to guide or monitor treatment for MRSA infections. Test performance is not FDA approved in patients less than 47 years old. Performed at [facility], [address] [place], [place] [address]     Labs: CBC: Recent Labs  Lab 07/10/24 2210 07/11/24 0820  WBC 6.5 5.9  NEUTROABS 3.9  --   HGB 14.2 13.9  HCT 42.7 41.4  MCV 91.8 91.6  PLT 180 174   Basic Metabolic Panel: Recent Labs  Lab 07/10/24 2210 07/11/24 0820  NA 141 143  K 3.9 3.9  CL 108 108  CO2 24 23  GLUCOSE 116* 100*  BUN 20 17  CREATININE 0.99 0.67  CALCIUM 9.5 9.5   Liver Function Tests: Recent Labs  Lab 07/10/24 2210  AST 23  ALT 14  ALKPHOS 124  BILITOT 0.5  PROT 7.2  ALBUMIN 4.1    Discharge time spent: greater than 30 minutes.  Signed: Leita Blanch, MD [facility] 07/11/2024

## 2024-07-11 NOTE — ED Notes (Signed)
 Cardiologist at bedside discussing POC with pt and family

## 2024-07-11 NOTE — H&P (Addendum)
 [place]   PATIENT NAME: Katrina Beasley    MR#:  [id number]  DATE OF BIRTH:  [birth year]  DATE OF ADMISSION:  07/10/2024  PRIMARY CARE PHYSICIAN: Epifanio Alm SQUIBB, MD   Patient is coming from: Home  REQUESTING/REFERRING PHYSICIAN: Ward, Josette SAILOR, DO  CHIEF COMPLAINT:  Dancing going on in my chest   HISTORY OF PRESENT ILLNESS:  Paden Kuras is a 83 y.o. Caucasian female with medical history significant for hypothyroidism, paroxysmal atrial fibrillation and IBS, who presented to the emergency room with acute onset of a feeling of dancing going on in the chest with palpitations without chest pain.  She denied any nausea or vomiting or diaphoresis.  She admitted to headache and dizziness with blurry vision.  No paresthesias or focal muscle weakness.  She has been having urinary frequency and urgency without dysuria or hematuria or flank pain.  No fever or chills.  No melena or bright red bleeding per rectum.  No other bleeding diathesis.  ED Course: Upon presentation to the emergency room heart rate was 182 with blood pressure 153/96 with otherwise normal vital signs.  Labs revealed unremarkable CMP.  Serum lipase was 61.  BNP was 462.5 and high-sensitivity troponin I was 16 and later 40.  CBC was within normal.  INR is 1.4 and PT 18. EKG as reviewed by me : Supraventricular cardia with a rate 181 and Q waves in anteroseptal leads. Imaging: Portable chest x-ray showed chronic changes with no acute cardiopulmonary disease.  The patient was given 6 mg of IV adenosine  with conversion to normal sinus rhythm and 4V aspirin  as well as 500 mL IV normal saline bolus, 5 mg of IV hydralazine  and 25 mg of p.o. Lopressor .  She will be admitted to a progressive unit observation bed for further evaluation and management PAST MEDICAL HISTORY:   Past Medical History:  Diagnosis Date   Hypothyroidism    IBS (irritable bowel syndrome)    Paroxysmal A-fib (HCC)     PAST SURGICAL HISTORY:    Past Surgical History:  Procedure Laterality Date   BREAST BIOPSY Left 1980   needle bx x 2 Benign per pt    SOCIAL HISTORY:   Social History   Tobacco Use   Smoking status: Never   Smokeless tobacco: Never  Substance Use Topics   Alcohol use: Not on file    FAMILY HISTORY:   Family History  Problem Relation Age of Onset   Breast cancer Mother 57    DRUG ALLERGIES:   Allergies  Allergen Reactions   Cyclosporine Other (See Comments) and Rash    Burning   Sulfa Antibiotics Itching   Azithromycin Diarrhea    Exfoliative mucosal rash.    REVIEW OF SYSTEMS:   ROS As per history of present illness. All pertinent systems were reviewed above. Constitutional, HEENT, cardiovascular, respiratory, GI, GU, musculoskeletal, neuro, psychiatric, endocrine, integumentary and hematologic systems were reviewed and are otherwise negative/unremarkable except for positive findings mentioned above in the HPI.   MEDICATIONS AT HOME:   Prior to Admission medications   Medication Sig Start Date End Date Taking? Authorizing Provider  metoprolol  tartrate (LOPRESSOR ) 25 MG tablet Take 1 tablet (25 mg total) by mouth 2 (two) times daily. 07/10/24 07/10/25 Yes Clarine Ozell LABOR, MD  apixaban  (ELIQUIS ) 2.5 MG TABS tablet Take 1 tablet (2.5 mg total) by mouth 2 (two) times daily. 10/20/23   Amin, Sumayya, MD  brimonidine -timolol  (COMBIGAN ) 0.2-0.5 % ophthalmic solution Place 1 drop  into both eyes every 12 (twelve) hours.    [provider]  diltiazem  (CARDIZEM  CD) 120 MG 24 hr capsule Take 1 capsule (120 mg total) by mouth daily. 10/20/23   Amin, Sumayya, MD  flecainide  (TAMBOCOR ) 100 MG tablet Take 1 tablet (100 mg total) by mouth every 12 (twelve) hours. 10/20/23   Amin, Sumayya, MD  latanoprost  (XALATAN ) 0.005 % ophthalmic solution Place 1 drop into both eyes at bedtime. 06/09/17   [provider]  levothyroxine  (SYNTHROID ) 125 MCG tablet Take 1 tablet (125 mcg total) by mouth  daily before breakfast. 10/21/23   Caleen Qualia, MD  metoprolol  tartrate (LOPRESSOR ) 50 MG tablet Take 1 tablet (50 mg total) by mouth 2 (two) times daily. 10/20/23   Amin, Sumayya, MD  Multiple Vitamins-Minerals (PRESERVISION AREDS 2+MULTI VIT PO) Take by mouth 2 (two) times daily.    [provider]  ondansetron  (ZOFRAN -ODT) 4 MG disintegrating tablet Take 4 mg by mouth every 8 (eight) hours as needed. 10/06/23   [provider]  Zinc Oxide (DESITIN) 40 % PSTE Apply 1 Application topically daily. 05/09/23   Abdul Fine, MD      VITAL SIGNS:  Blood pressure (!) 154/90, pulse 87, temperature 98 F (36.7 C), temperature source Oral, resp. rate 18, height 5' 5 (1.651 m), weight 54.4 kg, SpO2 100%.  PHYSICAL EXAMINATION:  Physical Exam  GENERAL:  83 y.o.-year-old Caucasian female patient lying in the bed with no acute distress.  EYES: Pupils equal, round, reactive to light and accommodation. No scleral icterus. Extraocular muscles intact.  HEENT: Head atraumatic, normocephalic. Oropharynx and nasopharynx clear.  NECK:  Supple, no jugular venous distention. No thyroid  enlargement, no tenderness.  LUNGS: Normal breath sounds bilaterally, no wheezing, rales,rhonchi or crepitation. No use of accessory muscles of respiration.  CARDIOVASCULAR: Regular rate and rhythm, S1, S2 normal. No murmurs, rubs, or gallops.  ABDOMEN: Soft, nondistended, nontender. Bowel sounds present. No organomegaly or mass.  EXTREMITIES: No pedal edema, cyanosis, or clubbing.  NEUROLOGIC: Cranial nerves II through XII are intact. Muscle strength 5/5 in all extremities. Sensation intact. Gait not checked.  PSYCHIATRIC: The patient is alert and oriented x 3.  Normal affect and good eye contact. SKIN: No obvious rash, lesion, or ulcer.   LABORATORY PANEL:   CBC Recent Labs  Lab 07/10/24 2210  WBC 6.5  HGB 14.2  HCT 42.7  PLT 180    ------------------------------------------------------------------------------------------------------------------  Chemistries  Recent Labs  Lab 07/10/24 2210  NA 141  K 3.9  CL 108  CO2 24  GLUCOSE 116*  BUN 20  CREATININE 0.99  CALCIUM 9.5  AST 23  ALT 14  ALKPHOS 124  BILITOT 0.5   ------------------------------------------------------------------------------------------------------------------  Cardiac Enzymes No results for input(s): TROPONINI in the last 168 hours. ------------------------------------------------------------------------------------------------------------------  RADIOLOGY:  DG Chest Portable 1 View Result Date: 07/10/2024 CLINICAL DATA:  Palpitations EXAM: PORTABLE CHEST 1 VIEW COMPARISON:  None Available. FINDINGS: Heart and mediastinal contours within normal limits. No acute confluent airspace opacities or effusions. Chronic changes/scarring in the lungs. No acute bony abnormality. IMPRESSION: Chronic changes.  No active disease. Electronically Signed   By: Franky Crease M.D.   On: 07/10/2024 23:05      IMPRESSION AND PLAN:  Assessment and Plan: * SVT (supraventricular tachycardia) (HCC) - This is responded to adenosine . - She will be admitted to observation progressive unit bed. - She will be monitored for further arrhythmias. - Will optimize electrolytes. - Will continue Lopressor . - Cardiology consult will be obtained. -  I notified Dr. Custovic about the patient.  Essential hypertension - We will continue antihypertensive therapy.  Hypothyroidism Will continue Synthroid  and check TSH.  Paroxysmal atrial fibrillation (HCC) - Will continue Eliquis  and Lopressor  as well as Cardizem  CD and Tambocor .    DVT prophylaxis: Eliquis . Advanced Care Planning:  Code Status: full code. Family Communication:  The plan of care was discussed in details with the patient (and family). I answered all questions. The patient agreed to proceed with  the above mentioned plan. Further management will depend upon hospital course. Disposition Plan: Back to previous home environment Consults called: Cardiology All the records are reviewed and case discussed with ED provider.  Status is: Observation  I certify that at the time of admission, it is my clinical judgment that the patient will require hospital care extending less than 2 midnights.                            Dispo: The patient is from: Home              Anticipated d/c is to: Home              Patient currently is not medically stable to d/c.              Difficult to place patient: No  Madison DELENA Peaches M.D on 07/11/2024 at 6:04 AM  [facility]   From 7 PM-7 AM, contact night-coverage [email or web address]  CC: Primary care physician; Epifanio Alm SQUIBB, MD
